# Patient Record
Sex: Male | Born: 1970
Health system: Southern US, Community
[De-identification: ages and names within clinical notes are randomized; demographics above are authoritative.]

## PROBLEM LIST (undated history)

## (undated) DIAGNOSIS — E66811 Obesity, class 1: Secondary | ICD-10-CM

## (undated) DIAGNOSIS — F32A Depression, unspecified: Secondary | ICD-10-CM

## (undated) DIAGNOSIS — E669 Obesity, unspecified: Secondary | ICD-10-CM

## (undated) DIAGNOSIS — R030 Elevated blood-pressure reading, without diagnosis of hypertension: Secondary | ICD-10-CM

## (undated) DIAGNOSIS — M47816 Spondylosis without myelopathy or radiculopathy, lumbar region: Secondary | ICD-10-CM

## (undated) DIAGNOSIS — F419 Anxiety disorder, unspecified: Secondary | ICD-10-CM

## (undated) DIAGNOSIS — J189 Pneumonia, unspecified organism: Secondary | ICD-10-CM

## (undated) DIAGNOSIS — Z8679 Personal history of other diseases of the circulatory system: Secondary | ICD-10-CM

## (undated) DIAGNOSIS — M545 Low back pain, unspecified: Secondary | ICD-10-CM

## (undated) DIAGNOSIS — G4752 REM sleep behavior disorder: Secondary | ICD-10-CM

## (undated) DIAGNOSIS — E781 Pure hyperglyceridemia: Secondary | ICD-10-CM

## (undated) DIAGNOSIS — M7711 Lateral epicondylitis, right elbow: Secondary | ICD-10-CM

## (undated) DIAGNOSIS — J309 Allergic rhinitis, unspecified: Secondary | ICD-10-CM

## (undated) DIAGNOSIS — I10 Essential (primary) hypertension: Secondary | ICD-10-CM

## (undated) DIAGNOSIS — F329 Major depressive disorder, single episode, unspecified: Secondary | ICD-10-CM

## (undated) DIAGNOSIS — E039 Hypothyroidism, unspecified: Secondary | ICD-10-CM

## (undated) DIAGNOSIS — K5790 Diverticulosis of intestine, part unspecified, without perforation or abscess without bleeding: Secondary | ICD-10-CM

## (undated) DIAGNOSIS — K219 Gastro-esophageal reflux disease without esophagitis: Secondary | ICD-10-CM

## (undated) DIAGNOSIS — K5792 Diverticulitis of intestine, part unspecified, without perforation or abscess without bleeding: Secondary | ICD-10-CM

## (undated) DIAGNOSIS — M7712 Lateral epicondylitis, left elbow: Secondary | ICD-10-CM

## (undated) DIAGNOSIS — Z8701 Personal history of pneumonia (recurrent): Secondary | ICD-10-CM

## (undated) DIAGNOSIS — G8929 Other chronic pain: Secondary | ICD-10-CM

## (undated) DIAGNOSIS — G4726 Circadian rhythm sleep disorder, shift work type: Secondary | ICD-10-CM

## (undated) DIAGNOSIS — G4733 Obstructive sleep apnea (adult) (pediatric): Secondary | ICD-10-CM

## (undated) DIAGNOSIS — M1710 Unilateral primary osteoarthritis, unspecified knee: Secondary | ICD-10-CM

## (undated) HISTORY — DX: Spondylosis without myelopathy or radiculopathy, lumbar region: M47.816

## (undated) HISTORY — DX: Allergic rhinitis, unspecified: J30.9

## (undated) HISTORY — DX: Unilateral primary osteoarthritis, unspecified knee: M17.10

## (undated) HISTORY — DX: Essential (primary) hypertension: I10

## (undated) HISTORY — DX: Diverticulitis of intestine, part unspecified, without perforation or abscess without bleeding: K57.92

## (undated) HISTORY — DX: Lateral epicondylitis, right elbow: M77.11

## (undated) HISTORY — DX: REM sleep behavior disorder: G47.52

## (undated) HISTORY — DX: Other chronic pain: G89.29

## (undated) HISTORY — DX: Low back pain, unspecified: M54.50

## (undated) HISTORY — DX: Elevated blood-pressure reading, without diagnosis of hypertension: R03.0

## (undated) HISTORY — DX: Obesity, class 1: E66.811

## (undated) HISTORY — PX: ELBOW SURGERY: SHX618

## (undated) HISTORY — DX: Circadian rhythm sleep disorder, shift work type: G47.26

## (undated) HISTORY — DX: Obstructive sleep apnea (adult) (pediatric): G47.33

## (undated) HISTORY — DX: Depression, unspecified: F32.A

## (undated) HISTORY — DX: Personal history of other diseases of the circulatory system: Z86.79

## (undated) HISTORY — DX: Pure hyperglyceridemia: E78.1

## (undated) HISTORY — DX: Major depressive disorder, single episode, unspecified: F32.9

## (undated) HISTORY — DX: Lateral epicondylitis, left elbow: M77.12

## (undated) HISTORY — DX: Low back pain: M54.5

## (undated) HISTORY — DX: Personal history of pneumonia (recurrent): Z87.01

## (undated) HISTORY — PX: OTHER SURGICAL HISTORY: SHX169

## (undated) HISTORY — DX: Diverticulosis of intestine, part unspecified, without perforation or abscess without bleeding: K57.90

## (undated) HISTORY — DX: Anxiety disorder, unspecified: F41.9

## (undated) HISTORY — DX: Obesity, unspecified: E66.9

---
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
## 1996-01-15 DIAGNOSIS — Z8679 Personal history of other diseases of the circulatory system: Secondary | ICD-10-CM

## 1996-01-15 HISTORY — DX: Personal history of other diseases of the circulatory system: Z86.79

## 1998-01-10 ENCOUNTER — Emergency Department (HOSPITAL_COMMUNITY): Admission: EM | Admit: 1998-01-10 | Discharge: 1998-01-10 | Payer: Self-pay | Admitting: Emergency Medicine

## 1998-06-06 ENCOUNTER — Inpatient Hospital Stay (HOSPITAL_COMMUNITY): Admission: EM | Admit: 1998-06-06 | Discharge: 1998-06-10 | Payer: Self-pay | Admitting: Emergency Medicine

## 1998-06-06 ENCOUNTER — Encounter: Payer: Self-pay | Admitting: Internal Medicine

## 1998-06-08 ENCOUNTER — Encounter: Payer: Self-pay | Admitting: Emergency Medicine

## 2002-12-30 ENCOUNTER — Emergency Department (HOSPITAL_COMMUNITY): Admission: EM | Admit: 2002-12-30 | Discharge: 2002-12-30 | Payer: Self-pay | Admitting: Emergency Medicine

## 2009-12-21 ENCOUNTER — Emergency Department (HOSPITAL_COMMUNITY): Admission: EM | Admit: 2009-12-21 | Discharge: 2009-10-04 | Payer: Self-pay | Admitting: Emergency Medicine

## 2011-02-26 ENCOUNTER — Emergency Department (HOSPITAL_COMMUNITY): Payer: Worker's Compensation

## 2011-02-26 ENCOUNTER — Emergency Department (HOSPITAL_COMMUNITY)
Admission: EM | Admit: 2011-02-26 | Discharge: 2011-02-26 | Disposition: A | Discharge status: Home / Self Care | Payer: Worker's Compensation | Attending: Emergency Medicine | Admitting: Emergency Medicine

## 2011-02-26 ENCOUNTER — Encounter (HOSPITAL_COMMUNITY): Payer: Self-pay | Admitting: Emergency Medicine

## 2011-02-26 DIAGNOSIS — S4980XA Other specified injuries of shoulder and upper arm, unspecified arm, initial encounter: Secondary | ICD-10-CM | POA: Insufficient documentation

## 2011-02-26 DIAGNOSIS — M25519 Pain in unspecified shoulder: Secondary | ICD-10-CM | POA: Insufficient documentation

## 2011-02-26 DIAGNOSIS — M542 Cervicalgia: Secondary | ICD-10-CM | POA: Insufficient documentation

## 2011-02-26 DIAGNOSIS — X500XXA Overexertion from strenuous movement or load, initial encounter: Secondary | ICD-10-CM | POA: Insufficient documentation

## 2011-02-26 DIAGNOSIS — S46909A Unspecified injury of unspecified muscle, fascia and tendon at shoulder and upper arm level, unspecified arm, initial encounter: Secondary | ICD-10-CM | POA: Insufficient documentation

## 2011-02-26 DIAGNOSIS — F17200 Nicotine dependence, unspecified, uncomplicated: Secondary | ICD-10-CM | POA: Insufficient documentation

## 2011-02-26 DIAGNOSIS — Y990 Civilian activity done for income or pay: Secondary | ICD-10-CM | POA: Insufficient documentation

## 2011-02-26 DIAGNOSIS — Z79899 Other long term (current) drug therapy: Secondary | ICD-10-CM | POA: Insufficient documentation

## 2011-02-26 MED ORDER — HYDROCODONE-ACETAMINOPHEN 5-325 MG PO TABS: 1.0000 | ORAL_TABLET | ORAL | Status: DC | PRN

## 2011-02-26 MED ORDER — IBUPROFEN 800 MG PO TABS: 800.0000 mg | ORAL_TABLET | Freq: Three times a day (TID) | ORAL | Status: AC

## 2011-02-26 MED ORDER — OXYCODONE-ACETAMINOPHEN 5-325 MG PO TABS: 1.0000 | ORAL_TABLET | ORAL | Status: AC | PRN

## 2011-02-26 NOTE — ED Notes (Signed)
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

## 2011-02-26 NOTE — ED Provider Notes (Signed)
History     CSN: [id number]  Arrival date & time 02/26/11  0204   First MD Initiated Contact with Patient 02/26/11 [phone]      Chief Complaint  Patient presents with  . Shoulder Injury    (Consider location/radiation/quality/duration/timing/severity/associated sxs/prior treatment) HPI History provided by pt.   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  Pain in right posterior neck as well. Aggravated by shoulder flexion.  Denies RUE weakness and paresthesias.  No prior h/o shoulder injury.    History reviewed. No pertinent past medical history.  Past Surgical History  Procedure Date  . Spleenectomy   . Arthroscopic knee surgery   . Elbow surgery   . Extraction of wisdom teeth     History reviewed. No pertinent family history.  History  Substance Use Topics  . Smoking status: Current Some Day Smoker  . Smokeless tobacco: Former Neurosurgeon  . Alcohol Use: Yes      Review of Systems  All other systems reviewed and are negative.    Allergies  Penicillins  Home Medications   Current Outpatient Rx  Name Route Sig Dispense Refill  . HYDROCODONE-ACETAMINOPHEN 10-325 MG PO TABS Oral Take 1 tablet by mouth every 6 (six) hours as needed. Pain    . LANSOPRAZOLE 15 MG PO CPDR Oral Take 15 mg by mouth daily.    Marland Kitchen LEVOTHYROXINE SODIUM 50 MCG PO TABS Oral Take 50 mcg by mouth daily.    Marland Kitchen PAROXETINE HCL 20 MG PO TABS Oral Take 20 mg by mouth every morning.    . IBUPROFEN 800 MG PO TABS Oral Take 1 tablet (800 mg total) by mouth 3 (three) times daily. 12 tablet 0  . OXYCODONE-ACETAMINOPHEN 5-325 MG PO TABS Oral Take 1 tablet by mouth every 4 (four) hours as needed for pain. 15 tablet 0    BP 152/78  Pulse 72  Temp(Src) 98.4 F (36.9 C) (Oral)  Resp 18  SpO2 98%  Physical Exam  Musculoskeletal:       Cervical spine non-tender.  Right trap non-tender.  Right shoulder w/out deformity or skin changes.   Mile tenderness at [facility] joint only.  Pain w/ passive flexion greater than 90deg.  Full abduction but reports pain while lower arm to side.  Nml elbow and wrist exam.  2+ radial pulse and distal sensation intact.     ED Course  Procedures (including critical care time)  Labs Reviewed - No data to display Dg Shoulder Right  02/26/2011  *RADIOLOGY REPORT*  Clinical Data: Painful right shoulder with limited range of motion. The patient was sanding a car and felt a pop.  RIGHT SHOULDER - 2+ VIEW  Comparison: None.  Findings: Mild degenerative changes in the glenohumeral and acromioclavicular joints.  No evidence of acute fracture or subluxation.  No focal bone lesion or bone destruction.  Bone cortex and trabecular architecture appear intact.  IMPRESSION: No acute bony abnormalities identified.  Original Report Authenticated By: Marlon Pel, M.D.     1. Pain in shoulder       MDM  Pt presents w/ acute onset right shoulder pain while waxing a car.  Xray neg for fx/dislocation.  Likely has a rotator cuff strain.  Nursing staff placed in a shoulder sling and pt d/c'd home w/ short course of percocet.  He takes vicodin qhs for chronic low back pain and did not have relief w/ this  medication prior to arrival.  Referred to ortho for persistent sx.          Arie Sabina [place], [place] 02/26/11 [phone]

## 2011-02-26 NOTE — Discharge Instructions (Signed)
Take vicodin as prescribed for severe pain.   Do not drive within four hours of taking this medication (may cause drowsiness or confusion).  Take ibuprofen w/ food up to three times a day, as needed for pain.  Ice and do gentle range of motion exercises at least 3 times a day.  Activity as tolerated.  Follow up with Dr. Charlann Boxer if pain has not started to improve in 5-7 days.   You may return to the ER if symptoms worsen or you have any other concerns.

## 2011-02-27 NOTE — ED Provider Notes (Signed)
Medical screening examination/treatment/procedure(s) were performed by non-physician practitioner and as supervising physician I was immediately available for consultation/collaboration.  Adithi Gammon K Aizza Santiago-Rasch, MD 02/27/11 2301 

## 2011-11-19 ENCOUNTER — Emergency Department (HOSPITAL_BASED_OUTPATIENT_CLINIC_OR_DEPARTMENT_OTHER): Payer: 59

## 2011-11-19 ENCOUNTER — Encounter (HOSPITAL_BASED_OUTPATIENT_CLINIC_OR_DEPARTMENT_OTHER): Payer: Self-pay | Admitting: Emergency Medicine

## 2011-11-19 ENCOUNTER — Emergency Department (HOSPITAL_BASED_OUTPATIENT_CLINIC_OR_DEPARTMENT_OTHER)
Admission: EM | Admit: 2011-11-19 | Discharge: 2011-11-19 | Disposition: A | Discharge status: Home / Self Care | Payer: 59 | Attending: Emergency Medicine | Admitting: Emergency Medicine

## 2011-11-19 DIAGNOSIS — F0781 Postconcussional syndrome: Secondary | ICD-10-CM

## 2011-11-19 DIAGNOSIS — F17200 Nicotine dependence, unspecified, uncomplicated: Secondary | ICD-10-CM | POA: Insufficient documentation

## 2011-11-19 DIAGNOSIS — R110 Nausea: Secondary | ICD-10-CM | POA: Insufficient documentation

## 2011-11-19 MED ORDER — MECLIZINE HCL 25 MG PO TABS: 25.0000 mg | ORAL_TABLET | Freq: Four times a day (QID) | ORAL | Status: DC | PRN

## 2011-11-19 NOTE — ED Provider Notes (Signed)
History     CSN: [id number]  Arrival date & time 11/19/11  4098   First MD Initiated Contact with Patient 11/19/11 [phone]      Chief Complaint  Patient presents with  . Dizziness    (Consider location/radiation/quality/duration/timing/severity/associated sxs/prior treatment) HPI This 41 year old male fell a few steps of the deer tree stand 4 days ago and since then has had some mild fleeting transient intermittent slight vertigo with some nausea with certain position changes and some slight decreased hearing in his right ear. He has had no headache. He has had no amnesia loss of consciousness altered mental status or change in speech vision swallowing or understanding and no localized or lateralizing weakness numbness or incoordination. He was at work tonight when he bent over a certain position causing some slight nausea and slight vertigo which is now resolved. His coworkers told him to come to the ED to get checked out for possible concussion. He has not had a headache but he did have a large amount of swelling to the left parietal scalp area where he has had at that swelling is now resolved. She's had no neck pain back pain chest pain shortness breath or abdominal pain. He's had no pain to his extremities other than some transient left upper arm bruising without bony pain or joint pain weakness or numbness. There is no treatment prior to arrival. He has had elevated blood pressures that his doctor is following, but has not been started on blood pressure medicines yet. History reviewed. No pertinent past medical history.  Past Surgical History  Procedure Date  . Spleenectomy   . Arthroscopic knee surgery   . Elbow surgery   . Extraction of wisdom teeth     History reviewed. No pertinent family history.  History  Substance Use Topics  . Smoking status: Current Some Day Smoker  . Smokeless tobacco: Former Neurosurgeon  . Alcohol Use: Yes      Review of Systems 10 Systems reviewed and  are negative for acute change except as noted in the HPI. Allergies  Penicillins  Home Medications   Current Outpatient Rx  Name  Route  Sig  Dispense  Refill  . LANSOPRAZOLE 15 MG PO CPDR   Oral   Take 15 mg by mouth daily.         Marland Kitchen LEVOTHYROXINE SODIUM 50 MCG PO TABS   Oral   Take 50 mcg by mouth daily.         Marland Kitchen PAROXETINE HCL 20 MG PO TABS   Oral   Take 20 mg by mouth every morning.         Marland Kitchen HYDROCODONE-ACETAMINOPHEN 10-325 MG PO TABS   Oral   Take 1 tablet by mouth every 6 (six) hours as needed. Pain         . MECLIZINE HCL 25 MG PO TABS   Oral   Take 1 tablet (25 mg total) by mouth every 6 (six) hours as needed for dizziness.   20 tablet   0     BP 157/94  Pulse 86  Resp 16  SpO2 100%  Physical Exam  Nursing note and vitals reviewed. Constitutional:       Awake, alert, nontoxic appearance with baseline speech for patient.  HENT:  Mouth/Throat: No oropharyngeal exudate.       Patient has minimal tenderness to the left parietal scalp area where he had a hematoma after the fall where he hit his head. There is no swelling or  palpable deformity to the area now.  TMs clear bilat.  Eyes: EOM are normal. Pupils are equal, round, and reactive to light. Right eye exhibits no discharge. Left eye exhibits no discharge.  Neck: Neck supple.  Cardiovascular: Normal rate and regular rhythm.   No murmur heard. Pulmonary/Chest: Effort normal and breath sounds normal. No stridor. No respiratory distress. He has no wheezes. He has no rales. He exhibits no tenderness.  Abdominal: Soft. Bowel sounds are normal. He exhibits no mass. There is no tenderness. There is no rebound.  Musculoskeletal: He exhibits no tenderness.       Baseline ROM, moves extremities with no obvious new focal weakness.  Lymphadenopathy:    He has no cervical adenopathy.  Neurological:       Awake, alert, cooperative and aware of situation; motor strength bilaterally; sensation normal to light  touch bilaterally; peripheral visual fields full to confrontation; no facial asymmetry; tongue midline; major cranial nerves appear intact; no pronator drift, normal finger to nose bilaterally, baseline gait without new ataxia.  There is currently no nystagmus and no disconjugate gaze when eyes are uncovered one at a time.  Skin: No rash noted.  Psychiatric: He has a normal mood and affect.    ED Course  Procedures (including critical care time)  Labs Reviewed - No data to display Ct Head Wo Contrast  11/19/2011  *RADIOLOGY REPORT*  Clinical Data: Status post fall from deer stand; hematoma on the left side of the head, with headache and dizziness.  CT HEAD WITHOUT CONTRAST  Technique:  Contiguous axial images were obtained from the base of the skull through the vertex without contrast.  Comparison: None.  Findings: There is no evidence of acute infarction, mass lesion, or intra- or extra-axial hemorrhage on CT.  Tiny foci of increased attenuation within the lateral ventricles are thought to reflect choroid plexus calcification.  The posterior fossa, including the cerebellum, brainstem and fourth ventricle, is within normal limits.  The third and lateral ventricles, and basal ganglia are unremarkable in appearance.  The cerebral hemispheres are symmetric in appearance, with normal gray- white differentiation.  No mass effect or midline shift is seen.  There is no evidence of fracture; visualized osseous structures are unremarkable in appearance.  The visualized portions of the orbits are within normal limits.  The paranasal sinuses and mastoid air cells are well-aerated.  A small focal hematoma is noted overlying the left frontoparietal calvarium.  IMPRESSION:  1.  No evidence of traumatic intracranial injury or fracture. 2.  Small scalp hematoma overlying the left frontoparietal calvarium.   Original Report Authenticated By: Tonia Ghent, M.D.      1. Postconcussive syndrome       MDM  Pt stable  in ED with no significant deterioration in condition.  Patient / Family / Caregiver informed of clinical course, understand medical decision-making process, and agree with plan.  I doubt any other EMC precluding discharge at this time including, but not necessarily limited to the following:[facility].         Hurman Horn, MD 11/19/11 [phone]

## 2011-11-19 NOTE — ED Notes (Signed)
Pt reports no loss of consciousness from fall

## 2011-11-19 NOTE — ED Notes (Signed)
Pt noted to have small hematoma to left side of head, denies h/a, blurred vision

## 2011-11-19 NOTE — ED Notes (Signed)
Pt states he fell out of tree stand to ground on Friday nov 1, pt reports aroun 5-6 foot fall, fell on left arm and hit left side of head. Pt has noticed decreased hearing and developed dizziness today

## 2012-01-15 DIAGNOSIS — M7711 Lateral epicondylitis, right elbow: Secondary | ICD-10-CM

## 2012-01-15 HISTORY — DX: Lateral epicondylitis, right elbow: M77.11

## 2014-03-03 ENCOUNTER — Emergency Department (HOSPITAL_COMMUNITY)
Admission: EM | Admit: 2014-03-03 | Discharge: 2014-03-03 | Disposition: A | Discharge status: Home / Self Care | Payer: Worker's Compensation | Attending: Emergency Medicine | Admitting: Emergency Medicine

## 2014-03-03 ENCOUNTER — Encounter (HOSPITAL_COMMUNITY): Payer: Self-pay | Admitting: *Deleted

## 2014-03-03 DIAGNOSIS — Y998 Other external cause status: Secondary | ICD-10-CM | POA: Diagnosis not present

## 2014-03-03 DIAGNOSIS — Z8739 Personal history of other diseases of the musculoskeletal system and connective tissue: Secondary | ICD-10-CM | POA: Insufficient documentation

## 2014-03-03 DIAGNOSIS — Z720 Tobacco use: Secondary | ICD-10-CM | POA: Insufficient documentation

## 2014-03-03 DIAGNOSIS — Z791 Long term (current) use of non-steroidal anti-inflammatories (NSAID): Secondary | ICD-10-CM | POA: Insufficient documentation

## 2014-03-03 DIAGNOSIS — W228XXA Striking against or struck by other objects, initial encounter: Secondary | ICD-10-CM | POA: Insufficient documentation

## 2014-03-03 DIAGNOSIS — Y9289 Other specified places as the place of occurrence of the external cause: Secondary | ICD-10-CM | POA: Insufficient documentation

## 2014-03-03 DIAGNOSIS — Z79899 Other long term (current) drug therapy: Secondary | ICD-10-CM | POA: Diagnosis not present

## 2014-03-03 DIAGNOSIS — S71111A Laceration without foreign body, right thigh, initial encounter: Secondary | ICD-10-CM

## 2014-03-03 DIAGNOSIS — Y9389 Activity, other specified: Secondary | ICD-10-CM | POA: Insufficient documentation

## 2014-03-03 MED ORDER — BACITRACIN ZINC 500 UNIT/GM EX OINT: 1.0000 "application " | TOPICAL_OINTMENT | Freq: Two times a day (BID) | CUTANEOUS | Status: DC

## 2014-03-03 MED ORDER — LIDOCAINE-EPINEPHRINE (PF) 2 %-1:200000 IJ SOLN
10.0000 mL | Freq: Once | INTRAMUSCULAR | Status: DC
  Filled 2014-03-03: qty 20

## 2014-03-03 NOTE — Discharge Instructions (Signed)

## 2014-03-03 NOTE — ED Notes (Signed)
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

## 2014-03-03 NOTE — ED Provider Notes (Signed)
CSN: [id number]     Arrival date & time 03/03/14  0423 History   First MD Initiated Contact with Patient 03/03/14 [phone]     Chief Complaint  Patient presents with  . Extremity Laceration    (Consider location/radiation/quality/duration/timing/severity/associated sxs/prior Treatment) Patient is a 44 y.o. male presenting with skin laceration. The history is provided by the patient. No language interpreter was used.  Laceration Location:  Leg Leg laceration location:  R upper leg Length (cm):  6cm Depth:  Through underlying tissue Quality: straight   Bleeding: controlled   Time since incident:  30 minutes Injury mechanism: lost control of power sander which hit patient in R thigh. Pain details:    Quality:  Burning   Severity:  Mild   Timing:  Constant   Progression:  Unchanged Foreign body present:  No foreign bodies Worsened by:  Pressure Tetanus status:  Up to date   Past Medical History  Diagnosis Date  . Ruptured lumbar disc    Past Surgical History  Procedure Laterality Date  . Spleenectomy    . Arthroscopic knee surgery    . Elbow surgery    . Extraction of wisdom teeth     No family history on file. History  Substance Use Topics  . Smoking status: Current Every Day Smoker  . Smokeless tobacco: Former NeurosurgeonUser    Types: Snuff  . Alcohol Use: Yes    Review of Systems  Musculoskeletal: Positive for myalgias.  Skin: Positive for wound.  All other systems reviewed and are negative.   Allergies  Penicillins  Home Medications   Prior to Admission medications   Medication Sig Start Date End Date Taking? Authorizing Provider  bacitracin ointment Apply 1 application topically 2 (two) times daily. 03/03/14   Antony MaduraKelly Deem Marmol, PA-C  diclofenac (VOLTAREN) 75 MG EC tablet Take 75 mg by mouth every 12 (twelve) hours. 02/21/14   Historical Provider, MD  HYDROcodone-acetaminophen (NORCO) 10-325 MG per tablet Take 1 tablet by mouth every 6 (six) hours as needed. Pain     Historical Provider, MD  lansoprazole (PREVACID) 15 MG capsule Take 15 mg by mouth daily.    Historical Provider, MD  levothyroxine (SYNTHROID, LEVOTHROID) 75 MCG tablet Take 75 mcg by mouth daily. 12/27/13   Historical Provider, MD  meclizine (ANTIVERT) 25 MG tablet Take 1 tablet (25 mg total) by mouth every 6 (six) hours as needed for dizziness. 11/19/11   Hurman HornJohn M Bednar, MD  PARoxetine (PAXIL) 20 MG tablet Take 20 mg by mouth every morning.    Historical Provider, MD   BP 154/85 mmHg  Pulse 83  Temp(Src) 98.9 F (37.2 C) (Oral)  Resp 20  SpO2 99%   Physical Exam  Constitutional: He is oriented to person, place, and time. He appears well-developed and well-nourished. No distress.  Patient nontoxic/nonseptic appearing  HENT:  Head: Normocephalic and atraumatic.  Eyes: Conjunctivae and EOM are normal. No scleral icterus.  Neck: Normal range of motion.  Cardiovascular: Normal rate, regular rhythm and intact distal pulses.   DP and PT pulse 2+ in RLE  Pulmonary/Chest: Effort normal. No respiratory distress.  Respirations even and unlabored  Musculoskeletal: Normal range of motion.       Right upper leg: He exhibits tenderness and laceration. He exhibits no bony tenderness, no swelling, no edema and no deformity.       Legs: Neurological: He is alert and oriented to person, place, and time. He exhibits normal muscle tone. Coordination normal.  Sensation to light  touch intact in right lower extremity.  Skin: Skin is warm and dry. No rash noted. He is not diaphoretic. No erythema. No pallor.  6 cm laceration noted to the mid to distal right thigh. Bleeding controlled. Laceration noted to be through subcutaneous fat. No muscle involvement or exposure. No foreign bodies noted.  Psychiatric: He has a normal mood and affect. His behavior is normal.  Nursing note and vitals reviewed.   ED Course  Procedures (including critical care time) Labs Review Labs Reviewed - No data to  display  Imaging Review No results found.   EKG Interpretation None      LACERATION REPAIR Performed by: Antony Madura Authorized by: Antony Madura Consent: Verbal consent obtained. Risks and benefits: risks, benefits and alternatives were discussed Consent given by: patient Patient identity confirmed: provided demographic data Prepped and Draped in normal sterile fashion Wound explored  Laceration Location: R thigh  Laceration Length: 6cm  No Foreign Bodies seen or palpated  Anesthesia: local infiltration  Local anesthetic: lidocaine 2% with epinephrine  Anesthetic total: 12 ml  Irrigation method: syringe Amount of cleaning: standard  Skin closure: 4-0 ethilon  Number of sutures: 4 horizontal mattress; 1 simple interrupted  Technique: see above  Patient tolerance: Patient tolerated the procedure well with no immediate complications.  MDM   Final diagnoses:  Thigh laceration, right, initial encounter    Tdap booster UTD. Pressure irrigation performed. Laceration occurred < 8 hours prior to repair which was well tolerated. Patient has no comorbidities to effect normal wound healing. Discussed suture home care with patient and answered questions. Patient to follow up for wound check and suture removal in 12 days with PCP. Patient is hemodynamically stable with no complaints prior to discharge. Return precautions given. Patient agreeable to plan with no unaddressed concerns.     Filed Vitals:   03/03/14 0429  BP: 154/85  Pulse: 83  Temp: 98.9 F (37.2 C)  TempSrc: Oral  Resp: 20  SpO2: 99%     Antony Madura, PA-C 03/03/14 0540  Olivia Mackie, MD 03/03/14 [phone]

## 2014-03-15 ENCOUNTER — Encounter (HOSPITAL_COMMUNITY): Payer: Self-pay | Admitting: Emergency Medicine

## 2014-03-15 ENCOUNTER — Emergency Department (HOSPITAL_COMMUNITY)
Admission: EM | Admit: 2014-03-15 | Discharge: 2014-03-15 | Disposition: A | Discharge status: Home / Self Care | Payer: Worker's Compensation | Attending: Emergency Medicine | Admitting: Emergency Medicine

## 2014-03-15 DIAGNOSIS — Z79899 Other long term (current) drug therapy: Secondary | ICD-10-CM | POA: Diagnosis not present

## 2014-03-15 DIAGNOSIS — Z8739 Personal history of other diseases of the musculoskeletal system and connective tissue: Secondary | ICD-10-CM | POA: Diagnosis not present

## 2014-03-15 DIAGNOSIS — Z87891 Personal history of nicotine dependence: Secondary | ICD-10-CM | POA: Insufficient documentation

## 2014-03-15 DIAGNOSIS — Z880 Allergy status to penicillin: Secondary | ICD-10-CM | POA: Insufficient documentation

## 2014-03-15 DIAGNOSIS — Z4802 Encounter for removal of sutures: Secondary | ICD-10-CM | POA: Diagnosis present

## 2014-03-15 DIAGNOSIS — Z792 Long term (current) use of antibiotics: Secondary | ICD-10-CM | POA: Diagnosis not present

## 2014-03-15 NOTE — Discharge Instructions (Signed)

## 2014-03-15 NOTE — ED Notes (Signed)
Pt states he needs his stitches taken out of his right leg  Pt states they have been in 12 days today

## 2014-03-15 NOTE — ED Provider Notes (Signed)
CSN: [id number]     Arrival date & time 03/15/14  [id number] History   First MD Initiated Contact with Patient 03/15/14 [phone]     Chief Complaint  Patient presents with  . Suture / Staple Removal     (Consider location/radiation/quality/duration/timing/severity/associated sxs/prior Treatment) Patient is a 44 y.o. male presenting with suture removal. The history is provided by the patient. No language interpreter was used.  Suture / Staple Removal Pertinent negatives include no chills, fever or numbness. Associated symptoms comments: Here for suture removal from laceration repair performed 03/03/14 to right thigh. No complaints. .    Past Medical History  Diagnosis Date  . Ruptured lumbar disc    Past Surgical History  Procedure Laterality Date  . Spleenectomy    . Arthroscopic knee surgery    . Elbow surgery    . Extraction of wisdom teeth     History reviewed. No pertinent family history. History  Substance Use Topics  . Smoking status: Former Games developermoker  . Smokeless tobacco: Current User    Types: Snuff, Chew  . Alcohol Use: Yes    Review of Systems  Constitutional: Negative for fever and chills.  Musculoskeletal: Negative.   Skin:       See HPI.  Neurological: Negative.  Negative for numbness.      Allergies  Penicillins  Home Medications   Prior to Admission medications   Medication Sig Start Date End Date Taking? Authorizing Provider  bacitracin ointment Apply 1 application topically 2 (two) times daily. 03/03/14   Antony MaduraKelly Humes, PA-C  diclofenac (VOLTAREN) 75 MG EC tablet Take 75 mg by mouth every 12 (twelve) hours. 02/21/14   Historical Provider, MD  HYDROcodone-acetaminophen (NORCO) 10-325 MG per tablet Take 1 tablet by mouth every 6 (six) hours as needed. Pain    Historical Provider, MD  lansoprazole (PREVACID) 15 MG capsule Take 15 mg by mouth daily.    Historical Provider, MD  levothyroxine (SYNTHROID, LEVOTHROID) 75 MCG tablet Take 75 mcg by mouth daily. 12/27/13    Historical Provider, MD  meclizine (ANTIVERT) 25 MG tablet Take 1 tablet (25 mg total) by mouth every 6 (six) hours as needed for dizziness. 11/19/11   Hurman HornJohn M Bednar, MD  PARoxetine (PAXIL) 20 MG tablet Take 20 mg by mouth every morning.    Historical Provider, MD   BP 158/101 mmHg  Pulse 85  Temp(Src) 98.1 F (36.7 C) (Oral)  Resp 18  SpO2 96% Physical Exam  Constitutional: He appears well-developed and well-nourished. No distress.  Skin:  Well healed laceration anterior right thigh with intact sutures. No drainage, redness.    ED Course  Procedures (including critical care time) Labs Review Labs Reviewed - No data to display  Imaging Review No results found.   EKG Interpretation None     PROCEDURE:   Sutures x 5 (4 horizontal mattress, one simple interrupted) sutures removed by Cameran Ahmed PA-C. MDM   Final diagnoses:  None    1. Suture removal.  Uncomplicated laceration repair.     Arnoldo HookerShari A Kimmarie Pascale, PA-C 03/15/14 [phone]  Tomasita CrumbleAdeleke Oni, MD 03/15/14 [phone]

## 2014-10-05 ENCOUNTER — Ambulatory Visit: Payer: Self-pay | Admitting: Orthopedic Surgery

## 2014-10-05 NOTE — Patient Instructions (Signed)
Nicholas Mckinney  10/05/2014   Your procedure is scheduled on:   10/13/2014    Report to [facility] Main  Entrance take [facility]  elevators to 3rd floor to  Short Stay Center at     0900 AM.  Call this number if you have problems the morning of surgery [phone]   Remember: ONLY 1 PERSON MAY GO WITH YOU TO SHORT STAY TO GET  READY MORNING OF YOUR SURGERY.  Do not eat food or drink liquids :After Midnight.     Take these medicines the morning of surgery with A SIP OF WATER:   Levothyroxine ( synthroid), Lansoprazole ( Prevacid), Paxil ( Paroxetine)  DO NOT TAKE ANY DIABETIC MEDICATIONS DAY OF YOUR SURGERY                               You may not have any metal on your body including hair pins and              piercings  Do not wear jewelry, make-up, lotions, powders or perfumes, deodorant             Do not wear nail polish.  Do not shave  48 hours prior to surgery.              Men may shave face and neck.   Do not bring valuables to the hospital. [name] IS NOT             RESPONSIBLE   FOR VALUABLES.  Contacts, dentures or bridgework may not be worn into surgery.  Leave suitcase in the car. After surgery it may be brought to your room.         Special Instructions: coughing and deep breathing exercises,leg exercises               Please read over the following fact sheets you were given: _____________________________________________________________________             [facility] - Preparing for Surgery Before surgery, you can play an important role.  Because skin is not sterile, your skin needs to be as free of germs as possible.  You can reduce the number of germs on your skin by washing with CHG (chlorahexidine gluconate) soap before surgery.  CHG is an antiseptic cleaner which kills germs and bonds with the skin to continue killing germs even after washing. Please DO NOT use if you have an allergy to CHG or antibacterial soaps.  If your skin  becomes reddened/irritated stop using the CHG and inform your nurse when you arrive at Short Stay. Do not shave (including legs and underarms) for at least 48 hours prior to the first CHG shower.  You may shave your face/neck. Please follow these instructions carefully:  1.  Shower with CHG Soap the night before surgery and the  morning of Surgery.  2.  If you choose to wash your hair, wash your hair first as usual with your  normal  shampoo.  3.  After you shampoo, rinse your hair and body thoroughly to remove the  shampoo.                           4.  Use CHG as you would any other liquid soap.  You can apply chg directly  to the skin and wash                       Gently with a scrungie or clean washcloth.  5.  Apply the CHG Soap to your body ONLY FROM THE NECK DOWN.   Do not use on face/ open                           Wound or open sores. Avoid contact with eyes, ears mouth and genitals (private parts).                       Wash face,  Genitals (private parts) with your normal soap.             6.  Wash thoroughly, paying special attention to the area where your surgery  will be performed.  7.  Thoroughly rinse your body with warm water from the neck down.  8.  DO NOT shower/wash with your normal soap after using and rinsing off  the CHG Soap.                9.  Pat yourself dry with a clean towel.            10.  Wear clean pajamas.            11.  Place clean sheets on your bed the night of your first shower and do not  sleep with pets. Day of Surgery : Do not apply any lotions/deodorants the morning of surgery.  Please wear clean clothes to the hospital/surgery center.  FAILURE TO FOLLOW THESE INSTRUCTIONS MAY RESULT IN THE CANCELLATION OF YOUR SURGERY PATIENT SIGNATURE_________________________________  NURSE SIGNATURE__________________________________  ________________________________________________________________________   Rogelia Mire  An incentive spirometer is a  tool that can help keep your lungs clear and active. This tool measures how well you are filling your lungs with each breath. Taking long deep breaths may help reverse or decrease the chance of developing breathing (pulmonary) problems (especially infection) following:  A long period of time when you are unable to move or be active. BEFORE THE PROCEDURE   If the spirometer includes an indicator to show your best effort, your nurse or respiratory therapist will set it to a desired goal.  If possible, sit up straight or lean slightly forward. Try not to slouch.  Hold the incentive spirometer in an upright position. INSTRUCTIONS FOR USE  1. Sit on the edge of your bed if possible, or sit up as far as you can in bed or on a chair. 2. Hold the incentive spirometer in an upright position. 3. Breathe out normally. 4. Place the mouthpiece in your mouth and seal your lips tightly around it. 5. Breathe in slowly and as deeply as possible, raising the piston or the ball toward the top of the column. 6. Hold your breath for 3-5 seconds or for as long as possible. Allow the piston or ball to fall to the bottom of the column. 7. Remove the mouthpiece from your mouth and breathe out normally. 8. Rest for a few seconds and repeat Steps 1 through 7 at least 10 times every 1-2 hours when you are awake. Take your time and take a few normal breaths between deep breaths. 9. The spirometer may include an indicator to show your best effort. Use the indicator as a goal to work toward during each repetition. 10. After each  set of 10 deep breaths, practice coughing to be sure your lungs are clear. If you have an incision (the cut made at the time of surgery), support your incision when coughing by placing a pillow or rolled up towels firmly against it. Once you are able to get out of bed, walk around indoors and cough well. You may stop using the incentive spirometer when instructed by your caregiver.  RISKS AND  COMPLICATIONS  Take your time so you do not get dizzy or light-headed.  If you are in pain, you may need to take or ask for pain medication before doing incentive spirometry. It is harder to take a deep breath if you are having pain. AFTER USE  Rest and breathe slowly and easily.  It can be helpful to keep track of a log of your progress. Your caregiver can provide you with a simple table to help with this. If you are using the spirometer at home, follow these instructions: SEEK MEDICAL CARE IF:   You are having difficultly using the spirometer.  You have trouble using the spirometer as often as instructed.  Your pain medication is not giving enough relief while using the spirometer.  You develop fever of 100.5 F (38.1 C) or higher. SEEK IMMEDIATE MEDICAL CARE IF:   You cough up bloody sputum that had not been present before.  You develop fever of 102 F (38.9 C) or greater.  You develop worsening pain at or near the incision site. MAKE SURE YOU:   Understand these instructions.  Will watch your condition.  Will get help right away if you are not doing well or get worse. Document Released: 05/13/2006 Document Revised: 03/25/2011 Document Reviewed: 07/14/2006 [facility] Patient Information 2014 [place], [place].   ________________________________________________________________________

## 2014-10-05 NOTE — Progress Notes (Signed)
Please put orders in Epic surgery 10-13-14 pre op 10-06-14 Thanks

## 2014-10-05 NOTE — H&P (Signed)
Nicholas Mckinney is an 44 y.o. male.   Chief Complaint: back and L leg pain HPI: The patient is a 44 year old male who presents with back pain. The patient is here today in referral from Dr. Ethelene Hal. The patient reports low back symptoms including pain which began 8 year(s) ago without any known injury. and Symptoms include numbness (left foot), burning (left low back) and tingling (left leg). The pain radiates to the left buttock and left posterior thigh. The patient describes the pain as sharp. The patient describes the severity of their symptoms as 4 / 10. Symptoms are exacerbated by sitting. Current treatment includes nonsteroidal anti-inflammatory drugs (Ibuprofen), opioid analgesics (Norco), ice packs and heating pad. Prior to being seen today the patient was previously evaluated by Dr. Ethelene Hal. Past evaluation has included MRI of the lumbar spine. Past treatment has included nonsteroidal anti-inflammatory drugs, opioid analgesics and Left S1 SNRB.  His MRI 09/16/2014 demonstrates moderate disc degeneration at L4-5 and at L5-S1, mild at L3-4. He does have a paracentral disc protrusion at L5-S1 to the left with slight extrusion. There is associated impingement of left S1 nerve root in the lateral recess secondary to facet hypertrophy and the disc protrusion. It is on image 21 of 31, series 5 of the axial images. T2, the right at L4-5, he has a disc protrusion on the right at L4-5 into the foramen of L4 on the right, but no impingement on the L5 root. L3-4, no disc protrusion noted. He also has mild foraminal stenosis of L5 on the left secondary to facet hypertrophy.  Past Medical History  Diagnosis Date  . Ruptured lumbar disc     Past Surgical History  Procedure Laterality Date  . Spleenectomy    . Arthroscopic knee surgery    . Elbow surgery    . Extraction of wisdom teeth      No family history on file. Social History:  reports that he has quit smoking. His smokeless tobacco use includes Snuff  and Chew. He reports that he drinks alcohol. He reports that he does not use illicit drugs.  Allergies:  Allergies  Allergen Reactions  . Penicillins     All cillins     (Not in a hospital admission)  No results found for this or any previous visit (from the past 48 hour(s)). No results found.  Review of Systems  Constitutional: Negative.   HENT: Negative.   Eyes: Negative.   Respiratory: Negative.   Cardiovascular: Negative.   Gastrointestinal: Negative.   Genitourinary: Negative.   Musculoskeletal: Positive for back pain.  Skin: Negative.   Neurological: Positive for sensory change and focal weakness.  Psychiatric/Behavioral: Negative.     There were no vitals taken for this visit. Physical Exam  Constitutional: He is oriented to person, place, and time. He appears well-developed. He appears distressed.  HENT:  Head: Normocephalic and atraumatic.  Eyes: Pupils are equal, round, and reactive to light.  Neck: Normal range of motion.  Cardiovascular: Normal rate.   Respiratory: Effort normal.  GI: Soft.  Musculoskeletal:  On exam, he is in moderate distress, mood and affect are appropriate. He walks with an antalgic gait. He is tender in the left proximal gluteus. He has pain in forward flexion and extension of lumbar spine. Straight leg raise, buttock, thigh and calf pain. He has decreased plantar flexion on the left, decreased Achilles reflex, decreased sensation in S1 dermatome. EHLs 5+ to 4/5, left compared to the right.  Lumbar spine  exam reveals no evidence of soft tissue swelling, deformity or skin ecchymosis. On palpation there is no tenderness of the lumbar spine. No flank pain with percussion. The abdomen is soft and nontender. Nontender over the trochanters. No cellulitis or lymphadenopathy.  Good range of motion of the lumbar spine without associated pain. Straight leg raise is negative. Motor is 5/5 including EHL, tibialis anterior, plantar flexion, quadriceps  and hamstrings. Patient is normoreflexic. There is no Babinski or clonus. Sensory exam is intact to light touch. Patient has good distal pulses. No DVT. No pain and normal range of motion without instability of the hips, knees and ankles.  Neurological: He is alert and oriented to person, place, and time.    X-ray three-view demonstrates disc degeneration at L4-5, L5-S1. No instability on flexion and extension.  MRI 09/16/2014 demonstrates a disc herniation paracentral left with facet hypertrophy and disc space narrowing contributing to spinal stenosis and S1 nerve root compression.  Assessment/Plan 1. Chronic S1 radiculopathy secondary to disc herniation, lateral recess stenosis, myotome weakness, dermatomal dysesthesia refractory, chronic narcotic analgesics, unable to work. 2. Disc degeneration multiple levels, predominantly L4-5, L5-S1.  I had an extensive discussion with [name] concerning currently pathology going over treatment options. Two options: One live with the symptoms, continue pain management strategies, avoid re-injury. Two would be to consider lumbar decompression at L5-S1 on the left to decompress the S1 nerve root in the lateral recess to addresses S1 radicular pain. I did indicate that would not fix the disc or alleviate his discogenic pain, but he seems more symptomatic from his buttock and leg. Discussed ultimately the option for a two-level fusion. They are not interested in that at this point in time. I therefore feel it is reasonable, given his persistent symptoms refractory to conservative treatment, where we can decompress lateral recess and alleviate pressure on the S1 nerve root. 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 We discussed residual back symptoms requiring disc pressure management and possibility of a recurrent disc herniation and fusion in the future. No history of MRSA. He is not  a smoker. He chews tobacco. Continue with his current analgesics that he is taking. He remains out of work. I discussed this with his wife. Spent considerable time discussing these issues, reviewing the pathology, relevant anatomy and treatment options. Again, we will proceed accordingly.  Plan microlumbar decompression L5-S1  BISSELL, JACLYN M. PA-C for Dr. Shelle Iron 10/05/2014, 5:26 PM

## 2014-10-06 ENCOUNTER — Encounter (HOSPITAL_COMMUNITY): Payer: Self-pay

## 2014-10-06 ENCOUNTER — Encounter (HOSPITAL_COMMUNITY)
Admission: RE | Admit: 2014-10-06 | Discharge: 2014-10-06 | Disposition: A | Discharge status: Home / Self Care | Payer: Commercial Managed Care - HMO | Source: Ambulatory Visit | Attending: Specialist | Admitting: Specialist

## 2014-10-06 ENCOUNTER — Ambulatory Visit (HOSPITAL_COMMUNITY)
Admission: RE | Admit: 2014-10-06 | Discharge: 2014-10-06 | Disposition: A | Discharge status: Home / Self Care | Payer: Commercial Managed Care - HMO | Source: Ambulatory Visit | Attending: Orthopedic Surgery | Admitting: Orthopedic Surgery

## 2014-10-06 DIAGNOSIS — M5126 Other intervertebral disc displacement, lumbar region: Secondary | ICD-10-CM | POA: Insufficient documentation

## 2014-10-06 DIAGNOSIS — Z01818 Encounter for other preprocedural examination: Secondary | ICD-10-CM | POA: Insufficient documentation

## 2014-10-06 DIAGNOSIS — Z01812 Encounter for preprocedural laboratory examination: Secondary | ICD-10-CM | POA: Diagnosis not present

## 2014-10-06 DIAGNOSIS — M5136 Other intervertebral disc degeneration, lumbar region: Secondary | ICD-10-CM | POA: Diagnosis not present

## 2014-10-06 HISTORY — DX: Hypothyroidism, unspecified: E03.9

## 2014-10-06 HISTORY — DX: Gastro-esophageal reflux disease without esophagitis: K21.9

## 2014-10-06 LAB — SURGICAL PCR SCREEN
MRSA, PCR: NEGATIVE
Staphylococcus aureus: POSITIVE — AB

## 2014-10-06 LAB — BASIC METABOLIC PANEL
Anion gap: 8 (ref 5–15)
BUN: 23 mg/dL — ABNORMAL HIGH (ref 6–20)
CHLORIDE: 102 mmol/L (ref 101–111)
CO2: 27 mmol/L (ref 22–32)
CREATININE: 1.07 mg/dL (ref 0.61–1.24)
Calcium: 9.3 mg/dL (ref 8.9–10.3)
GFR calc non Af Amer: >60 mL/min (ref >60)
Glucose, Bld: 101 mg/dL — ABNORMAL HIGH (ref 65–99)
POTASSIUM: 4.4 mmol/L (ref 3.5–5.1)
SODIUM: 137 mmol/L (ref 135–145)

## 2014-10-06 LAB — CBC
HEMATOCRIT: 44.3 % (ref 39.0–52.0)
Hemoglobin: 14.9 g/dL (ref 13.0–17.0)
MCH: 31.2 pg (ref 26.0–34.0)
MCHC: 33.6 g/dL (ref 30.0–36.0)
MCV: 92.7 fL (ref 78.0–100.0)
Platelets: 463 10*3/uL — ABNORMAL HIGH (ref 150–400)
RBC: 4.78 MIL/uL (ref 4.22–5.81)
RDW: 13.3 % (ref 11.5–15.5)
WBC: 8.5 10*3/uL (ref 4.0–10.5)

## 2014-10-06 NOTE — Progress Notes (Signed)
[facility] results done 10/06/14 faxed via EPIC to Dr Shelle Iron.

## 2014-10-07 ENCOUNTER — Ambulatory Visit: Payer: Self-pay | Admitting: Orthopedic Surgery

## 2014-10-13 ENCOUNTER — Ambulatory Visit (HOSPITAL_COMMUNITY): Payer: Commercial Managed Care - HMO

## 2014-10-13 ENCOUNTER — Ambulatory Visit (HOSPITAL_COMMUNITY): Payer: Commercial Managed Care - HMO | Admitting: Anesthesiology

## 2014-10-13 ENCOUNTER — Encounter (HOSPITAL_COMMUNITY): Payer: Self-pay | Admitting: *Deleted

## 2014-10-13 ENCOUNTER — Ambulatory Visit (HOSPITAL_COMMUNITY)
Admission: RE | Admit: 2014-10-13 | Discharge: 2014-10-14 | Disposition: A | Discharge status: Home / Self Care | Payer: Commercial Managed Care - HMO | Source: Ambulatory Visit | Attending: Specialist | Admitting: Specialist

## 2014-10-13 ENCOUNTER — Encounter (HOSPITAL_COMMUNITY)
Admission: RE | Disposition: A | Discharge status: Home / Self Care | Payer: Self-pay | Source: Ambulatory Visit | Attending: Specialist

## 2014-10-13 DIAGNOSIS — Z87891 Personal history of nicotine dependence: Secondary | ICD-10-CM | POA: Diagnosis not present

## 2014-10-13 DIAGNOSIS — K219 Gastro-esophageal reflux disease without esophagitis: Secondary | ICD-10-CM | POA: Diagnosis not present

## 2014-10-13 DIAGNOSIS — M549 Dorsalgia, unspecified: Secondary | ICD-10-CM

## 2014-10-13 DIAGNOSIS — M48061 Spinal stenosis, lumbar region without neurogenic claudication: Secondary | ICD-10-CM | POA: Diagnosis present

## 2014-10-13 DIAGNOSIS — M5136 Other intervertebral disc degeneration, lumbar region: Secondary | ICD-10-CM | POA: Insufficient documentation

## 2014-10-13 DIAGNOSIS — E039 Hypothyroidism, unspecified: Secondary | ICD-10-CM | POA: Insufficient documentation

## 2014-10-13 DIAGNOSIS — M4806 Spinal stenosis, lumbar region: Secondary | ICD-10-CM | POA: Insufficient documentation

## 2014-10-13 DIAGNOSIS — Z880 Allergy status to penicillin: Secondary | ICD-10-CM | POA: Diagnosis not present

## 2014-10-13 DIAGNOSIS — M5126 Other intervertebral disc displacement, lumbar region: Secondary | ICD-10-CM | POA: Insufficient documentation

## 2014-10-13 HISTORY — PX: LUMBAR LAMINECTOMY/DECOMPRESSION MICRODISCECTOMY: SHX5026

## 2014-10-13 SURGERY — LUMBAR LAMINECTOMY/DECOMPRESSION MICRODISCECTOMY 1 LEVEL
Anesthesia: General | Laterality: Left

## 2014-10-13 MED ORDER — DEXAMETHASONE SODIUM PHOSPHATE 10 MG/ML IJ SOLN
INTRAMUSCULAR | Status: AC
  Filled 2014-10-13: qty 1

## 2014-10-13 MED ORDER — PROMETHAZINE HCL 25 MG/ML IJ SOLN: 6.2500 mg | INTRAMUSCULAR | Status: DC | PRN

## 2014-10-13 MED ORDER — ACETAMINOPHEN 325 MG PO TABS: 650.0000 mg | ORAL_TABLET | ORAL | Status: DC | PRN

## 2014-10-13 MED ORDER — HYDROCODONE-ACETAMINOPHEN 5-325 MG PO TABS
1.0000 | ORAL_TABLET | ORAL | Status: DC | PRN
  Administered 2014-10-13 – 2014-10-14 (×3): 2 via ORAL
  Filled 2014-10-13 (×3): qty 2

## 2014-10-13 MED ORDER — DEXAMETHASONE SODIUM PHOSPHATE 10 MG/ML IJ SOLN
INTRAMUSCULAR | Status: DC | PRN
  Administered 2014-10-13: 10 mg via INTRAVENOUS

## 2014-10-13 MED ORDER — MIDAZOLAM HCL 5 MG/5ML IJ SOLN
INTRAMUSCULAR | Status: DC | PRN
  Administered 2014-10-13: 2 mg via INTRAVENOUS

## 2014-10-13 MED ORDER — NEOSTIGMINE METHYLSULFATE 10 MG/10ML IV SOLN
INTRAVENOUS | Status: DC | PRN
  Administered 2014-10-13: 4 mg via INTRAVENOUS

## 2014-10-13 MED ORDER — SUCCINYLCHOLINE CHLORIDE 20 MG/ML IJ SOLN
INTRAMUSCULAR | Status: DC | PRN
  Administered 2014-10-13: 100 mg via INTRAVENOUS

## 2014-10-13 MED ORDER — PROPOFOL 10 MG/ML IV BOLUS
INTRAVENOUS | Status: DC | PRN
  Administered 2014-10-13: 200 mg via INTRAVENOUS

## 2014-10-13 MED ORDER — LACTATED RINGERS IV SOLN
INTRAVENOUS | Status: DC
  Administered 2014-10-13: 13:00:00 via INTRAVENOUS
  Administered 2014-10-13: 1000 mL via INTRAVENOUS

## 2014-10-13 MED ORDER — LIDOCAINE HCL (CARDIAC) 20 MG/ML IV SOLN
INTRAVENOUS | Status: AC
  Filled 2014-10-13: qty 5

## 2014-10-13 MED ORDER — FENTANYL CITRATE (PF) 250 MCG/5ML IJ SOLN
INTRAMUSCULAR | Status: AC
  Filled 2014-10-13: qty 25

## 2014-10-13 MED ORDER — ZOLPIDEM TARTRATE 5 MG PO TABS
5.0000 mg | ORAL_TABLET | Freq: Every evening | ORAL | Status: DC | PRN
  Administered 2014-10-14: 5 mg via ORAL
  Filled 2014-10-13: qty 1

## 2014-10-13 MED ORDER — CEFAZOLIN SODIUM-DEXTROSE 2-3 GM-% IV SOLR
INTRAVENOUS | Status: AC
  Filled 2014-10-13: qty 50

## 2014-10-13 MED ORDER — SODIUM CHLORIDE 0.9 % IR SOLN
Status: DC | PRN
  Administered 2014-10-13: 500 mL

## 2014-10-13 MED ORDER — ALUM & MAG HYDROXIDE-SIMETH 200-200-20 MG/5ML PO SUSP: 30.0000 mL | Freq: Four times a day (QID) | ORAL | Status: DC | PRN

## 2014-10-13 MED ORDER — LACTATED RINGERS IV SOLN: INTRAVENOUS | Status: DC

## 2014-10-13 MED ORDER — PROPOFOL 10 MG/ML IV BOLUS
INTRAVENOUS | Status: AC
  Filled 2014-10-13: qty 20

## 2014-10-13 MED ORDER — LEVOTHYROXINE SODIUM 75 MCG PO TABS
75.0000 ug | ORAL_TABLET | Freq: Every day | ORAL | Status: DC
  Administered 2014-10-14: 75 ug via ORAL
  Filled 2014-10-13 (×2): qty 1

## 2014-10-13 MED ORDER — PHENOL 1.4 % MT LIQD: 1.0000 | OROMUCOSAL | Status: DC | PRN

## 2014-10-13 MED ORDER — MEPERIDINE HCL 50 MG/ML IJ SOLN: 6.2500 mg | INTRAMUSCULAR | Status: DC | PRN

## 2014-10-13 MED ORDER — MIDAZOLAM HCL 2 MG/2ML IJ SOLN
INTRAMUSCULAR | Status: AC
  Filled 2014-10-13: qty 2

## 2014-10-13 MED ORDER — ROCURONIUM BROMIDE 100 MG/10ML IV SOLN
INTRAVENOUS | Status: AC
  Filled 2014-10-13: qty 1

## 2014-10-13 MED ORDER — LIDOCAINE HCL (CARDIAC) 20 MG/ML IV SOLN
INTRAVENOUS | Status: DC | PRN
  Administered 2014-10-13: 80 mg via INTRAVENOUS

## 2014-10-13 MED ORDER — RISAQUAD PO CAPS
1.0000 | ORAL_CAPSULE | Freq: Every day | ORAL | Status: DC
  Administered 2014-10-13: 1 via ORAL
  Filled 2014-10-13 (×2): qty 1

## 2014-10-13 MED ORDER — OXYCODONE-ACETAMINOPHEN 5-325 MG PO TABS: 1.0000 | ORAL_TABLET | ORAL | Status: DC | PRN

## 2014-10-13 MED ORDER — METHOCARBAMOL 500 MG PO TABS: 500.0000 mg | ORAL_TABLET | Freq: Four times a day (QID) | ORAL | Status: DC | PRN

## 2014-10-13 MED ORDER — BISACODYL 5 MG PO TBEC: 5.0000 mg | DELAYED_RELEASE_TABLET | Freq: Every day | ORAL | Status: DC | PRN

## 2014-10-13 MED ORDER — METHOCARBAMOL 1000 MG/10ML IJ SOLN
500.0000 mg | Freq: Four times a day (QID) | INTRAVENOUS | Status: DC | PRN
  Administered 2014-10-13: 500 mg via INTRAVENOUS
  Filled 2014-10-13 (×2): qty 5

## 2014-10-13 MED ORDER — SENNOSIDES-DOCUSATE SODIUM 8.6-50 MG PO TABS: 1.0000 | ORAL_TABLET | Freq: Every evening | ORAL | Status: DC | PRN

## 2014-10-13 MED ORDER — SODIUM CHLORIDE 0.9 % IR SOLN
Status: AC
  Filled 2014-10-13: qty 1

## 2014-10-13 MED ORDER — MAGNESIUM CITRATE PO SOLN
1.0000 | Freq: Once | ORAL | Status: DC | PRN
Start: 2014-10-13 — End: 2014-10-14

## 2014-10-13 MED ORDER — CEFAZOLIN SODIUM-DEXTROSE 2-3 GM-% IV SOLR
2.0000 g | INTRAVENOUS | Status: AC
  Administered 2014-10-13: 2 g via INTRAVENOUS

## 2014-10-13 MED ORDER — HYDROMORPHONE HCL 1 MG/ML IJ SOLN: 0.5000 mg | INTRAMUSCULAR | Status: DC | PRN

## 2014-10-13 MED ORDER — DOCUSATE SODIUM 100 MG PO CAPS
100.0000 mg | ORAL_CAPSULE | Freq: Two times a day (BID) | ORAL | Status: DC
Start: 2014-10-13 — End: 2014-10-14
  Administered 2014-10-13: 100 mg via ORAL

## 2014-10-13 MED ORDER — KCL IN DEXTROSE-NACL 20-5-0.45 MEQ/L-%-% IV SOLN
INTRAVENOUS | Status: DC
  Administered 2014-10-13: 18:00:00 via INTRAVENOUS
  Filled 2014-10-13 (×2): qty 1000

## 2014-10-13 MED ORDER — PAROXETINE HCL 30 MG PO TABS
30.0000 mg | ORAL_TABLET | Freq: Every day | ORAL | Status: DC
  Filled 2014-10-13: qty 1

## 2014-10-13 MED ORDER — ROCURONIUM BROMIDE 100 MG/10ML IV SOLN
INTRAVENOUS | Status: DC | PRN
  Administered 2014-10-13: 30 mg via INTRAVENOUS
  Administered 2014-10-13: 20 mg via INTRAVENOUS

## 2014-10-13 MED ORDER — DOCUSATE SODIUM 100 MG PO CAPS: 100.0000 mg | ORAL_CAPSULE | Freq: Two times a day (BID) | ORAL | Status: DC | PRN

## 2014-10-13 MED ORDER — HYDROMORPHONE HCL 1 MG/ML IJ SOLN
0.2500 mg | INTRAMUSCULAR | Status: DC | PRN
  Administered 2014-10-13 (×3): 0.5 mg via INTRAVENOUS

## 2014-10-13 MED ORDER — GLYCOPYRROLATE 0.2 MG/ML IJ SOLN
INTRAMUSCULAR | Status: DC | PRN
  Administered 2014-10-13: .8 mg via INTRAVENOUS

## 2014-10-13 MED ORDER — BUPIVACAINE-EPINEPHRINE (PF) 0.5% -1:200000 IJ SOLN
INTRAMUSCULAR | Status: AC
  Filled 2014-10-13: qty 30

## 2014-10-13 MED ORDER — ONDANSETRON HCL 4 MG/2ML IJ SOLN
INTRAMUSCULAR | Status: AC
  Filled 2014-10-13: qty 2

## 2014-10-13 MED ORDER — MENTHOL 3 MG MT LOZG
1.0000 | LOZENGE | OROMUCOSAL | Status: DC | PRN
Start: 2014-10-13 — End: 2014-10-14

## 2014-10-13 MED ORDER — ONDANSETRON HCL 4 MG/2ML IJ SOLN
INTRAMUSCULAR | Status: DC | PRN
  Administered 2014-10-13: 4 mg via INTRAVENOUS

## 2014-10-13 MED ORDER — PANTOPRAZOLE SODIUM 20 MG PO TBEC
20.0000 mg | DELAYED_RELEASE_TABLET | Freq: Every day | ORAL | Status: DC
  Filled 2014-10-13: qty 1

## 2014-10-13 MED ORDER — GLYCOPYRROLATE 0.2 MG/ML IJ SOLN
INTRAMUSCULAR | Status: AC
  Filled 2014-10-13: qty 3

## 2014-10-13 MED ORDER — FENTANYL CITRATE (PF) 250 MCG/5ML IJ SOLN
INTRAMUSCULAR | Status: DC | PRN
Start: 2014-10-13 — End: 2014-10-13
  Administered 2014-10-13 (×5): 50 ug via INTRAVENOUS

## 2014-10-13 MED ORDER — BUPIVACAINE-EPINEPHRINE 0.5% -1:200000 IJ SOLN
INTRAMUSCULAR | Status: AC
  Filled 2014-10-13: qty 1

## 2014-10-13 MED ORDER — BUPIVACAINE-EPINEPHRINE 0.5% -1:200000 IJ SOLN
INTRAMUSCULAR | Status: DC | PRN
  Administered 2014-10-13: 16 mL

## 2014-10-13 MED ORDER — ACETAMINOPHEN 650 MG RE SUPP: 650.0000 mg | RECTAL | Status: DC | PRN

## 2014-10-13 MED ORDER — GLYCOPYRROLATE 0.2 MG/ML IJ SOLN
INTRAMUSCULAR | Status: AC
  Filled 2014-10-13: qty 1

## 2014-10-13 MED ORDER — METHOCARBAMOL 500 MG PO TABS: 500.0000 mg | ORAL_TABLET | Freq: Three times a day (TID) | ORAL | Status: DC

## 2014-10-13 MED ORDER — HYDROMORPHONE HCL 1 MG/ML IJ SOLN
INTRAMUSCULAR | Status: AC
  Filled 2014-10-13: qty 1

## 2014-10-13 MED ORDER — CEFAZOLIN SODIUM-DEXTROSE 2-3 GM-% IV SOLR
2.0000 g | Freq: Three times a day (TID) | INTRAVENOUS | Status: DC
  Administered 2014-10-13 – 2014-10-14 (×2): 2 g via INTRAVENOUS
  Filled 2014-10-13 (×3): qty 50

## 2014-10-13 MED ORDER — THROMBIN 5000 UNITS EX SOLR
CUTANEOUS | Status: AC
  Filled 2014-10-13: qty 10000

## 2014-10-13 MED ORDER — MECLIZINE HCL 25 MG PO TABS
25.0000 mg | ORAL_TABLET | Freq: Four times a day (QID) | ORAL | Status: DC | PRN
  Filled 2014-10-13: qty 1

## 2014-10-13 MED ORDER — ONDANSETRON HCL 4 MG/2ML IJ SOLN: 4.0000 mg | INTRAMUSCULAR | Status: DC | PRN

## 2014-10-13 SURGICAL SUPPLY — 49 items
BAG SPEC THK2 15X12 ZIP CLS (MISCELLANEOUS)
BAG ZIPLOCK 12X15 (MISCELLANEOUS) IMPLANT
CLEANER TIP ELECTROSURG 2X2 (MISCELLANEOUS) ×3 IMPLANT
CLOSURE WOUND 1/2 X4 (GAUZE/BANDAGES/DRESSINGS) ×1
CLOTH 2% CHLOROHEXIDINE 3PK (PERSONAL CARE ITEMS) ×3 IMPLANT
DRAPE MICROSCOPE LEICA (MISCELLANEOUS) ×3 IMPLANT
DRAPE POUCH INSTRU U-SHP 10X18 (DRAPES) ×3 IMPLANT
DRAPE SHEET LG 3/4 BI-LAMINATE (DRAPES) ×3 IMPLANT
DRAPE SURG 17X11 SM STRL (DRAPES) ×3 IMPLANT
DRAPE UTILITY XL STRL (DRAPES) ×3 IMPLANT
DRSG AQUACEL AG ADV 3.5X 4 (GAUZE/BANDAGES/DRESSINGS) ×2 IMPLANT
DRSG AQUACEL AG ADV 3.5X 6 (GAUZE/BANDAGES/DRESSINGS) IMPLANT
DURAPREP 26ML APPLICATOR (WOUND CARE) ×3 IMPLANT
DURASEAL SPINE SEALANT 3ML (MISCELLANEOUS) IMPLANT
ELECT BLADE TIP CTD 4 INCH (ELECTRODE) ×2 IMPLANT
ELECT REM PT RETURN 9FT ADLT (ELECTROSURGICAL) ×3
ELECTRODE REM PT RTRN 9FT ADLT (ELECTROSURGICAL) ×1 IMPLANT
GLOVE BIOGEL PI IND STRL 7.0 (GLOVE) ×1 IMPLANT
GLOVE BIOGEL PI INDICATOR 7.0 (GLOVE) ×2
GLOVE SURG SS PI 7.0 STRL IVOR (GLOVE) ×3 IMPLANT
GLOVE SURG SS PI 7.5 STRL IVOR (GLOVE) ×1 IMPLANT
GLOVE SURG SS PI 8.0 STRL IVOR (GLOVE) ×6 IMPLANT
GOWN STRL REUS W/TWL XL LVL3 (GOWN DISPOSABLE) ×6 IMPLANT
IV CATH 14GX2 1/4 (CATHETERS) ×3 IMPLANT
KIT BASIN OR (CUSTOM PROCEDURE TRAY) ×3 IMPLANT
KIT POSITIONING SURG ANDREWS (MISCELLANEOUS) ×3 IMPLANT
MANIFOLD NEPTUNE II (INSTRUMENTS) ×3 IMPLANT
NDL SPNL 18GX3.5 QUINCKE PK (NEEDLE) ×2 IMPLANT
NEEDLE SPNL 18GX3.5 QUINCKE PK (NEEDLE) ×6 IMPLANT
PACK LAMINECTOMY ORTHO (CUSTOM PROCEDURE TRAY) ×3 IMPLANT
PATTIES SURGICAL .5 X.5 (GAUZE/BANDAGES/DRESSINGS) IMPLANT
PATTIES SURGICAL .75X.75 (GAUZE/BANDAGES/DRESSINGS) ×2 IMPLANT
PATTIES SURGICAL 1X1 (DISPOSABLE) ×2 IMPLANT
RUBBERBAND STERILE (MISCELLANEOUS) ×6 IMPLANT
SPONGE SURGIFOAM ABS GEL 100 (HEMOSTASIS) ×3 IMPLANT
STAPLER VISISTAT (STAPLE) IMPLANT
STRIP CLOSURE SKIN 1/2X4 (GAUZE/BANDAGES/DRESSINGS) ×2 IMPLANT
SUT NURALON 4 0 TR CR/8 (SUTURE) IMPLANT
SUT PROLENE 3 0 PS 2 (SUTURE) ×3 IMPLANT
SUT VIC AB 1 CT1 27 (SUTURE) ×3
SUT VIC AB 1 CT1 27XBRD ANTBC (SUTURE) IMPLANT
SUT VIC AB 1-0 CT2 27 (SUTURE) ×3 IMPLANT
SUT VIC AB 2-0 CT1 27 (SUTURE) ×3
SUT VIC AB 2-0 CT1 TAPERPNT 27 (SUTURE) IMPLANT
SUT VIC AB 2-0 CT2 27 (SUTURE) ×3 IMPLANT
SYR 3ML LL SCALE MARK (SYRINGE) ×2 IMPLANT
TOWEL OR 17X26 10 PK STRL BLUE (TOWEL DISPOSABLE) ×3 IMPLANT
TOWEL OR NON WOVEN STRL DISP B (DISPOSABLE) ×2 IMPLANT
YANKAUER SUCT BULB TIP NO VENT (SUCTIONS) ×2 IMPLANT

## 2014-10-13 NOTE — Op Note (Signed)
NAMEWELLES, WALTHALL NO.:  [id number]  MEDICAL RECORD NO.:  [id number]  LOCATION:  1602                         FACILITY:  [facility]  PHYSICIAN:  Jene Every, M.D.    DATE OF BIRTH:  Jun 16, 1970  DATE OF PROCEDURE:  10/13/2014 DATE OF DISCHARGE:                              OPERATIVE REPORT   PREOPERATIVE DIAGNOSES:  Spinal stenosis, herniated nucleus pulposus of L5-S1 left.  POSTOPERATIVE DIAGNOSIS:  Spinal stenosis, herniated nucleus pulposus of L5-S1 left.  PROCEDURES PERFORMED: 1. Microlumbar decompression, L5-S1 left. 2. Foraminotomies, L5-S1 left.  ANESTHESIA:  General.  ASSISTANT:  Lanna Poche, PA.  HISTORY:  A 44 year old with left lower extremity radicular pain secondary to disk herniation, lateral recess stenosis, neural tension signs, diminished Achilles reflex, diminished plantar flexion, altered sensation S1 dermatome indicated for decompression at L5-S1 disk.  Risk and benefits were discussed including bleeding, infection, damage to neurovascular structures, no change in symptoms, worsening symptoms, DVT, PE, anesthetic complications, etc.  TECHNIQUE:  With the patient in supine position, after induction of adequate general anesthesia, 2 g Kefzol, placed prone on the [place] frame.  All bony prominences well padded.  Lumbar region was prepped and draped in usual sterile fashion.  Two 18-gauge spinal needles were utilized to localize the 5-1 interspace, confirmed with x-ray.  Incision was made from spinous process L5-S1, subcutaneous tissue was dissected. Electrocautery was utilized to achieve hemostasis.  Dorsolumbar fascia identified and divided in line with skin incision.  Paraspinous muscle elevated from lamina 5 and 1.  McCullough retractors placed.  Operating microscope was draped and brought on the surgical field.  Confirmatory radiograph obtained.  Straight curette was utilized, detached ligamentum flavum from the cephalad  edge of S1.  A neural patty placed beneath the ligamentum flavum, foraminotomy of S1 was performed.  Ligamentum flavum removed from the interspace.  The S1 nerve root was noted to be compressed in the lateral recess, erythematous and edematous.  We protected the S1 nerve root gently mobilizing it medially.  Decompressed the lateral recess.  The medial border of the pedicle had a hypertrophic facet.  A combination of disc herniation and tethering epidural venous plexus was compressing the S1 nerve root in addition to the facet and ligamentum flavum hypertrophy.  Cauterized and lysed epidural venous plexus further mobilizing the S1 nerve root.  Focal HNP was noted at L5- S1.  I performed an annulotomy.  Copious portion of disk material was removed from the disk space with a straight pituitary.  Further mobilized with a nerve hook and antibiotic irrigation and multiple fragments were retrieved.  Following full diskectomy of herniated material and foraminotomy of L5, no residual disk herniation noted.  The S1 nerve root was found to be erythematous and edematous, but there was 1 cm of excursion the S1 nerve root medial pedicle without tension. Neural probe passed freely at the foramen of L5 and S1.  No active bleeding or CSF leakage.  Therefore, I removed the [facility] retractor. Obtained the final confirmatory radiograph at 5-1.  No active bleeding. We closed the dorsolumbar fascia with 1 Vicryl, subcu with 2- 0, and skin subcuticular Prolene.  Sterile dressing applied.  Placed  supine on the hospital bed, extubated without difficulty, and transported to the recovery room in satisfactory condition.  The patient tolerated the procedure well.  No complications.  Assistant, Lanna Poche, [place].  Minimal blood loss.     Jene Every, M.D.     Cordelia Pen  D:  10/13/2014  T:  10/13/2014  Job:  [id number]

## 2014-10-13 NOTE — Interval H&P Note (Signed)
History and Physical Interval Note:  10/13/2014 7:28 AM  Nicholas Mckinney  has presented today for surgery, with the diagnosis of STENOSIS HNP L5-S1 ON LEFT  The various methods of treatment have been discussed with the patient and family. After consideration of risks, benefits and other options for treatment, the patient has consented to  Procedure(s): MICRO LUMBAR DECOMPRESSION L5-S1 ON LEFT (Left) as a surgical intervention .  The patient's history has been reviewed, patient examined, no change in status, stable for surgery.  I have reviewed the patient's chart and labs.  Questions were answered to the patient's satisfaction.     BEANE,JEFFREY C

## 2014-10-13 NOTE — H&P (View-Only) (Signed)
Nicholas Mckinney is an 44 y.o. male.   Chief Complaint: back and L leg pain HPI: The patient is a 44 year old male who presents with back pain. The patient is here today in referral from Dr. Ramos. The patient reports low back symptoms including pain which began 8 year(s) ago without any known injury. and Symptoms include numbness (left foot), burning (left low back) and tingling (left leg). The pain radiates to the left buttock and left posterior thigh. The patient describes the pain as sharp. The patient describes the severity of their symptoms as 4 / 10. Symptoms are exacerbated by sitting. Current treatment includes nonsteroidal anti-inflammatory drugs (Ibuprofen), opioid analgesics (Norco), ice packs and heating pad. Prior to being seen today the patient was previously evaluated by Dr. Ramos. Past evaluation has included MRI of the lumbar spine. Past treatment has included nonsteroidal anti-inflammatory drugs, opioid analgesics and Left S1 SNRB.  His MRI 09/16/2014 demonstrates moderate disc degeneration at L4-5 and at L5-S1, mild at L3-4. He does have a paracentral disc protrusion at L5-S1 to the left with slight extrusion. There is associated impingement of left S1 nerve root in the lateral recess secondary to facet hypertrophy and the disc protrusion. It is on image 21 of 31, series 5 of the axial images. T2, the right at L4-5, he has a disc protrusion on the right at L4-5 into the foramen of L4 on the right, but no impingement on the L5 root. L3-4, no disc protrusion noted. He also has mild foraminal stenosis of L5 on the left secondary to facet hypertrophy.  Past Medical History  Diagnosis Date  . Ruptured lumbar disc     Past Surgical History  Procedure Laterality Date  . Spleenectomy    . Arthroscopic knee surgery    . Elbow surgery    . Extraction of wisdom teeth      No family history on file. Social History:  reports that he has quit smoking. His smokeless tobacco use includes Snuff  and Chew. He reports that he drinks alcohol. He reports that he does not use illicit drugs.  Allergies:  Allergies  Allergen Reactions  . Penicillins     All cillins     (Not in a hospital admission)  No results found for this or any previous visit (from the past 48 hour(s)). No results found.  Review of Systems  Constitutional: Negative.   HENT: Negative.   Eyes: Negative.   Respiratory: Negative.   Cardiovascular: Negative.   Gastrointestinal: Negative.   Genitourinary: Negative.   Musculoskeletal: Positive for back pain.  Skin: Negative.   Neurological: Positive for sensory change and focal weakness.  Psychiatric/Behavioral: Negative.     There were no vitals taken for this visit. Physical Exam  Constitutional: He is oriented to person, place, and time. He appears well-developed. He appears distressed.  HENT:  Head: Normocephalic and atraumatic.  Eyes: Pupils are equal, round, and reactive to light.  Neck: Normal range of motion.  Cardiovascular: Normal rate.   Respiratory: Effort normal.  GI: Soft.  Musculoskeletal:  On exam, he is in moderate distress, mood and affect are appropriate. He walks with an antalgic gait. He is tender in the left proximal gluteus. He has pain in forward flexion and extension of lumbar spine. Straight leg raise, buttock, thigh and calf pain. He has decreased plantar flexion on the left, decreased Achilles reflex, decreased sensation in S1 dermatome. EHLs 5+ to 4/5, left compared to the right.  Lumbar spine   exam reveals no evidence of soft tissue swelling, deformity or skin ecchymosis. On palpation there is no tenderness of the lumbar spine. No flank pain with percussion. The abdomen is soft and nontender. Nontender over the trochanters. No cellulitis or lymphadenopathy.  Good range of motion of the lumbar spine without associated pain. Straight leg raise is negative. Motor is 5/5 including EHL, tibialis anterior, plantar flexion, quadriceps  and hamstrings. Patient is normoreflexic. There is no Babinski or clonus. Sensory exam is intact to light touch. Patient has good distal pulses. No DVT. No pain and normal range of motion without instability of the hips, knees and ankles.  Neurological: He is alert and oriented to person, place, and time.    X-ray three-view demonstrates disc degeneration at L4-5, L5-S1. No instability on flexion and extension.  MRI 09/16/2014 demonstrates a disc herniation paracentral left with facet hypertrophy and disc space narrowing contributing to spinal stenosis and S1 nerve root compression.  Assessment/Plan 1. Chronic S1 radiculopathy secondary to disc herniation, lateral recess stenosis, myotome weakness, dermatomal dysesthesia refractory, chronic narcotic analgesics, unable to work. 2. Disc degeneration multiple levels, predominantly L4-5, L5-S1.  I had an extensive discussion with Nicholas Mckinney concerning currently pathology going over treatment options. Two options: One live with the symptoms, continue pain management strategies, avoid re-injury. Two would be to consider lumbar decompression at L5-S1 on the left to decompress the S1 nerve root in the lateral recess to addresses S1 radicular pain. I did indicate that would not fix the disc or alleviate his discogenic pain, but he seems more symptomatic from his buttock and leg. Discussed ultimately the option for a two-level fusion. They are not interested in that at this point in time. I therefore feel it is reasonable, given his persistent symptoms refractory to conservative treatment, where we can decompress lateral recess and alleviate pressure on the S1 nerve root. 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 We discussed residual back symptoms requiring disc pressure management and possibility of a recurrent disc herniation and fusion in the future. No history of MRSA. He is not  a smoker. He chews tobacco. Continue with his current analgesics that he is taking. He remains out of work. I discussed this with his wife. Spent considerable time discussing these issues, reviewing the pathology, relevant anatomy and treatment options. Again, we will proceed accordingly.  Plan microlumbar decompression L5-S1  BISSELL, JACLYN M. PA-C for Dr. Beane 10/05/2014, 5:26 PM    

## 2014-10-13 NOTE — Anesthesia Procedure Notes (Signed)
Procedure Name: Intubation Date/Time: 10/13/2014 11:38 AM Performed by: Jarvis Newcomer A Pre-anesthesia Checklist: Patient identified, Timeout performed, Emergency Drugs available, Suction available and Patient being monitored Patient Re-evaluated:Patient Re-evaluated prior to inductionOxygen Delivery Method: Circle system utilized Preoxygenation: Pre-oxygenation with 100% oxygen Intubation Type: IV induction Ventilation: Mask ventilation without difficulty and Oral airway inserted - appropriate to patient size Laryngoscope Size: Mac and 4 Grade View: Grade I Tube type: Oral Tube size: 7.5 mm Number of attempts: 1 Airway Equipment and Method: Stylet Placement Confirmation: breath sounds checked- equal and bilateral,  ETT inserted through vocal cords under direct vision and positive ETCO2 Secured at: 23 cm Tube secured with: Tape Dental Injury: Teeth and Oropharynx as per pre-operative assessment

## 2014-10-13 NOTE — Evaluation (Signed)
Physical Therapy Evaluation Patient Details Name: Nicholas Mckinney MRN: [id number] DOB: Feb 11, 1970 Today's Date: 10/13/2014   History of Present Illness  MICRO LUMBAR DECOMPRESSION L5-S1 ON LEFT (Left  Clinical Impression  Patient states" this is th best  I have felt in 8 years.". Patient mobilizing well. Eager to ambulate. Patient will benefit from PT to address problems listed in note below.    Follow Up Recommendations No PT follow up    Equipment Recommendations  None recommended by PT    Recommendations for Other Services       Precautions / Restrictions Precautions Precautions: Back Precaution Comments: handout, reviewed back precautions      Mobility  Bed Mobility Overal bed mobility: Needs Assistance Bed Mobility: Rolling;Sidelying to Sit Rolling: Supervision Sidelying to sit: Supervision       General bed mobility comments: cues for technique  Transfers Overall transfer level: Needs assistance   Transfers: Sit to/from Stand Sit to Stand: Supervision         General transfer comment: cues for hand placement  Ambulation/Gait Ambulation/Gait assistance: Supervision Ambulation Distance (Feet): 200 Feet         General Gait Details: IV pole, cues for turns  Stairs            Wheelchair Mobility    Modified Rankin (Stroke Patients Only)       Balance                                             Pertinent Vitals/Pain Pain Assessment: 0-10 Pain Score: 2  Pain Location: back incision Pain Descriptors / Indicators: Discomfort;Tightness Pain Intervention(s): Premedicated before session    Home Living Family/patient expects to be discharged to:: Private residence Living Arrangements: Spouse/significant other Available Help at Discharge: Family Type of Home: House Home Access: Stairs to enter Entrance Stairs-Rails: Right Entrance Stairs-Number of Steps: 6 Home Layout: One level Home Equipment: None      Prior  Function Level of Independence: Independent               Hand Dominance        Extremity/Trunk Assessment               Lower Extremity Assessment: Overall WFL for tasks assessed      Cervical / Trunk Assessment: Normal  Communication   Communication: No difficulties  Cognition Arousal/Alertness: Awake/alert Behavior During Therapy: WFL for tasks assessed/performed Overall Cognitive Status: Within Functional Limits for tasks assessed                      General Comments      Exercises        Assessment/Plan    PT Assessment Patient needs continued PT services  PT Diagnosis Difficulty walking;Acute pain   PT Problem List Decreased activity tolerance;Decreased mobility;Pain  PT Treatment Interventions Gait training;Stair training;Functional mobility training;Patient/family education   PT Goals (Current goals can be found in the Care Plan section) Acute Rehab PT Goals Patient Stated Goal: to walk without pain PT Goal Formulation: With patient/family Time For Goal Achievement: 10/15/14 Potential to Achieve Goals: Good    Frequency Min 6X/week   Barriers to discharge        Co-evaluation               End of Session   Activity Tolerance: Patient tolerated treatment  well Patient left: in chair;with call bell/phone within reach;with chair alarm set;with family/visitor present Nurse Communication: Mobility status    Functional Assessment Tool Used: clinical judgement Functional Limitation: Mobility: Walking and moving around Mobility: Walking and Moving Around Current Status (B1478): At least 1 percent but less than 20 percent impaired, limited or restricted Mobility: Walking and Moving Around Goal Status [phone]): 0 percent impaired, limited or restricted    Time: 1308-6578 PT Time Calculation (min) (ACUTE ONLY): 21 min   Charges:   PT Evaluation $Initial PT Evaluation Tier I: 1 Procedure     PT G Codes:   PT G-Codes **NOT  FOR INPATIENT CLASS** Functional Assessment Tool Used: clinical judgement Functional Limitation: Mobility: Walking and moving around Mobility: Walking and Moving Around Current Status (I6962): At least 1 percent but less than 20 percent impaired, limited or restricted Mobility: Walking and Moving Around Goal Status [id number]): 0 percent impaired, limited or restricted    [name] 10/13/2014, 6:27 PM

## 2014-10-13 NOTE — Anesthesia Preprocedure Evaluation (Addendum)
Anesthesia Evaluation  Patient identified by MRN, date of birth, ID band Patient awake    Reviewed: Allergy & Precautions, NPO status , Patient's Chart, lab work & pertinent test results  Airway Mallampati: II  TM Distance: >3 FB Neck ROM: Full    Dental  (+) Teeth Intact   Pulmonary former smoker,    breath sounds clear to auscultation       Cardiovascular negative cardio ROS   Rhythm:Regular Rate:Normal     Neuro/Psych negative neurological ROS  negative psych ROS   GI/Hepatic Neg liver ROS, GERD  Medicated,  Endo/Other  Hypothyroidism   Renal/GU negative Renal ROS  negative genitourinary   Musculoskeletal negative musculoskeletal ROS (+)   Abdominal   Peds negative pediatric ROS (+)  Hematology   Anesthesia Other Findings   Reproductive/Obstetrics negative OB ROS                           Lab Results  Component Value Date   WBC 8.5 10/06/2014   HGB 14.9 10/06/2014   HCT 44.3 10/06/2014   MCV 92.7 10/06/2014   PLT 463* 10/06/2014   Lab Results  Component Value Date   CREATININE 1.07 10/06/2014   BUN 23* 10/06/2014   NA 137 10/06/2014   K 4.4 10/06/2014   CL 102 10/06/2014   CO2 27 10/06/2014   No results found for: INR, PROTIME   Anesthesia Physical Anesthesia Plan  ASA: II  Anesthesia Plan: General   Post-op Pain Management:    Induction: Intravenous  Airway Management Planned: Oral ETT  Additional Equipment:   Intra-op Plan:   Post-operative Plan: Extubation in OR  Informed Consent: I have reviewed the patients History and Physical, chart, labs and discussed the procedure including the risks, benefits and alternatives for the proposed anesthesia with the patient or authorized representative who has indicated his/her understanding and acceptance.   Dental advisory given  Plan Discussed with: CRNA  Anesthesia Plan Comments:         Anesthesia  Quick Evaluation

## 2014-10-13 NOTE — Transfer of Care (Signed)
Immediate Anesthesia Transfer of Care Note  Patient: Nicholas Mckinney  Procedure(s) Performed: Procedure(s): MICRO LUMBAR DECOMPRESSION L5-S1 ON LEFT (Left)  Patient Location: PACU  Anesthesia Type:General  Level of Consciousness: awake, alert , oriented and patient cooperative  Airway & Oxygen Therapy: Patient Spontanous Breathing and Patient connected to face mask oxygen  Post-op Assessment: Report given to RN and Post -op Vital signs reviewed and stable  Post vital signs: Reviewed and stable  Last Vitals:  Filed Vitals:   10/13/14 0825  BP: 133/84  Pulse: 61  Temp: 36.8 C  Resp: 18    Complications: No apparent anesthesia complications

## 2014-10-13 NOTE — Anesthesia Postprocedure Evaluation (Signed)
  Anesthesia Post-op Note  Patient: Nicholas Mckinney  Procedure(s) Performed: Procedure(s): MICRO LUMBAR DECOMPRESSION L5-S1 ON LEFT (Left)  Patient Location: PACU  Anesthesia Type:General  Level of Consciousness: awake, alert  and oriented  Airway and Oxygen Therapy: Patient Spontanous Breathing  Post-op Pain: mild  Post-op Assessment: Post-op Vital signs reviewed and Patient's Cardiovascular Status Stable LLE Motor Response: Purposeful movement LLE Sensation: Full sensation RLE Motor Response: Purposeful movement RLE Sensation: Full sensation      Post-op Vital Signs: Reviewed and stable  Last Vitals:  Filed Vitals:   10/13/14 1504  BP: 128/90  Pulse: 69  Temp: 36.8 C  Resp: 16    Complications: No apparent anesthesia complications

## 2014-10-13 NOTE — Discharge Instructions (Signed)

## 2014-10-13 NOTE — Brief Op Note (Signed)
10/13/2014  12:49 PM  PATIENT:  Nicholas Mckinney  44 y.o. male  PRE-OPERATIVE DIAGNOSIS:  STENOSIS HNP L5-S1 ON LEFT  POST-OPERATIVE DIAGNOSIS:  STENOSIS HNP L5-S1 ON LEFT  PROCEDURE:  Procedure(s): MICRO LUMBAR DECOMPRESSION L5-S1 ON LEFT (Left)  SURGEON:  Surgeon(s) and Role:    * Jene Every, MD - Primary  PHYSICIAN ASSISTANT:   ASSISTANTS: Bissell   ANESTHESIA:   general  EBL:  Total I/O In: 1000 [I.V.:1000] Out: 50 [Blood:50]  BLOOD ADMINISTERED:none  DRAINS: none   LOCAL MEDICATIONS USED:  MARCAINE     SPECIMEN:  No Specimen  DISPOSITION OF SPECIMEN:  N/A  COUNTS:  YES  TOURNIQUET:  * No tourniquets in log *  DICTATION: .Other Dictation: Dictation Number [id number]  PLAN OF CARE: Admit for overnight observation  PATIENT DISPOSITION:  PACU - hemodynamically stable.   Delay start of Pharmacological VTE agent (>24hrs) due to surgical blood loss or risk of bleeding: yes

## 2014-10-14 ENCOUNTER — Encounter (HOSPITAL_COMMUNITY): Payer: Self-pay | Admitting: Specialist

## 2014-10-14 DIAGNOSIS — M5126 Other intervertebral disc displacement, lumbar region: Secondary | ICD-10-CM | POA: Diagnosis not present

## 2014-10-14 MED ORDER — DICLOFENAC SODIUM 75 MG PO TBEC: 75.0000 mg | DELAYED_RELEASE_TABLET | Freq: Every day | ORAL | Status: DC

## 2014-10-14 NOTE — Progress Notes (Signed)
Subjective: 1 Day Post-Op Procedure(s) (LRB): MICRO LUMBAR DECOMPRESSION L5-S1 ON LEFT (Left) Patient reports pain as mild.  Reports incisional back pain, well controlled. Some buttock pain, less severe than pre-op. Leg pain better. Feels the best he has in 8 years. No issues with voiding. Has been OOB with PT yesterday and this morning on his own with no issues. Feels ready for D/c home  Objective: Vital signs in last 24 hours: Temp:  [97.7 F (36.5 C)-98.7 F (37.1 C)] 98.1 F (36.7 C) (09/30 0635) Pulse Rate:  [58-97] 58 (09/30 0635) Resp:  [12-22] 16 (09/30 0635) BP: (113-133)/(54-90) 121/74 mmHg (09/30 0635) SpO2:  [93 %-100 %] 93 % (09/30 0635) Weight:  [101.606 kg (224 lb)-102 kg (224 lb 13.9 oz)] 102 kg (224 lb 13.9 oz) (09/29 1504)  Intake/Output from previous day: 09/29 0701 - 09/30 0700 In: 3345.5 [P.O.:1180; I.V.:2110.5; IV Piggyback:55] Out: 2275 [Urine:2225; Blood:50] Intake/Output this shift:    No results for input(s): HGB in the last 72 hours. No results for input(s): WBC, RBC, HCT, PLT in the last 72 hours. No results for input(s): NA, K, CL, CO2, BUN, CREATININE, GLUCOSE, CALCIUM in the last 72 hours. No results for input(s): LABPT, INR in the last 72 hours.  Neurologically intact ABD soft Neurovascular intact Sensation intact distally Intact pulses distally Dorsiflexion/Plantar flexion intact Incision: dressing C/D/I and no drainage No cellulitis present Compartment soft no sign of DVT  Assessment/Plan: 1 Day Post-Op Procedure(s) (LRB): MICRO LUMBAR DECOMPRESSION L5-S1 ON LEFT (Left) Advance diet Up with therapy D/C IV fluids  Discussed D/C instructions, dressing instructions, Lspine precautions D/C home today Follow up with Dr. Shelle Iron in 2 weeks for suture removal Will discuss with Dr. Elissa Lovett, JACLYN M. 10/14/2014, 8:16 AM

## 2014-10-14 NOTE — Evaluation (Signed)
Occupational Therapy One Time Evaluation Patient Details Name: Nicholas Mckinney MRN: [id number] DOB: 1970/06/11 Today's Date: 10/14/2014    History of Present Illness MICRO LUMBAR DECOMPRESSION L5-S1 ON LEFT (Left   Clinical Impression   Pt doing well. Needs overall supervision for safety with ADL to adhere to back precautions. He is min guard assist with tub transfer. Pt states he will obtain a 3in1 as recommended through family. All education completed and no further acute OT needs.    Follow Up Recommendations  No OT follow up;Supervision - Intermittent    Equipment Recommendations  None recommended by OT (pt plans to obtain 3in1 on his own)    Recommendations for Other Services       Precautions / Restrictions Precautions Precautions: Back Precaution Comments: pt has handout. reviewed all back precautions.      Mobility Bed Mobility               General bed mobility comments: pt up in room with family.  Transfers Overall transfer level: Needs assistance Equipment used: None Transfers: Sit to/from Stand Sit to Stand: Supervision         General transfer comment: cues for back precautions and hand placement.    Balance                                            ADL Overall ADL's : Needs assistance/impaired Eating/Feeding: Independent;Sitting   Grooming: Wash/dry hands;Supervision/safety;Standing   Upper Body Bathing: Supervision/ safety;Sitting   Lower Body Bathing: Supervison/ safety;Sit to/from stand;With adaptive equipment   Upper Body Dressing : Supervision/safety;Sitting   Lower Body Dressing: Supervision/safety;Sit to/from stand   Toilet Transfer: Supervision/safety;BSC   Toileting- Architect and Hygiene: Supervision/safety;Sitting/lateral lean   Tub/ Engineer, structural: Tub transfer;Min guard     General ADL Comments: Discussed LHS option for LB bathing in shower and pt plans to obtain one. He states he would  also like to get a reacher for picking up items. He did well with crossing LEs up for LB dressing. reviewed handout and all precautions. He would benefit from a 3in1 as he tends to twist to try to brace hand on wall and sit on comfort height commode. He states he can borrow a 3in1 or get one. Practiced side step over tub. Pt very close to twistng with trying to perform posterior peirarea hygiene so discussed a toilet aid and coverage/whrere to obtain to prevent twisting and pt verbalized understanding.      Vision     Perception     Praxis      Pertinent Vitals/Pain Pain Assessment: No/denies pain     Hand Dominance     Extremity/Trunk Assessment Upper Extremity Assessment Upper Extremity Assessment: Overall WFL for tasks assessed           Communication Communication Communication: No difficulties   Cognition Arousal/Alertness: Awake/alert Behavior During Therapy: WFL for tasks assessed/performed Overall Cognitive Status: Within Functional Limits for tasks assessed                     General Comments       Exercises       Shoulder Instructions      Home Living Family/patient expects to be discharged to:: Private residence Living Arrangements: Spouse/significant other Available Help at Discharge: Family Type of Home: House Home Access: Stairs to enter [employer] of  Steps: 6 Entrance Stairs-Rails: Right Home Layout: One level     Bathroom Shower/Tub: Chief Strategy Officer: Standard     Home Equipment: Bedside commode   Additional Comments: pt states he can borrow a 3in1. he states he plans to obtain a reacher and LHS      Prior Functioning/Environment Level of Independence: Independent             OT Diagnosis: Generalized weakness   OT Problem List:     OT Treatment/Interventions:      OT Goals(Current goals can be found in the care plan section) Acute Rehab OT Goals Patient Stated Goal: to continue to get  stronger. OT Goal Formulation: With patient  OT Frequency:     Barriers to D/C:            Co-evaluation              End of Session    Activity Tolerance: Patient tolerated treatment well Patient left:  (in room with family)   Time: 1610-9604 OT Time Calculation (min): 24 min Charges:  OT General Charges $OT Visit: 1 Procedure OT Evaluation $Initial OT Evaluation Tier I: 1 Procedure OT Treatments $Self Care/Home Management : 8-22 mins G-Codes: OT G-codes **NOT FOR INPATIENT CLASS** Functional Assessment Tool Used: clinical judgement Functional Limitation: Self care Self Care Current Status (V4098): At least 1 percent but less than 20 percent impaired, limited or restricted Self Care Goal Status (J1914): At least 1 percent but less than 20 percent impaired, limited or restricted Self Care Discharge Status [phone]): At least 1 percent but less than 20 percent impaired, limited or restricted  Lennox Laity  [phone] 10/14/2014, 9:52 AM

## 2014-10-14 NOTE — Discharge Summary (Signed)
Physician Discharge Summary   Patient ID: Nicholas Mckinney MRN: [id number] DOB/AGE: May 28, 1970 44 y.o.  Admit date: 10/13/2014 Discharge date: 10/14/2014  Primary Diagnosis:   STENOSIS HNP L5-S1 ON LEFT  Admission Diagnoses:  Past Medical History  Diagnosis Date  . Ruptured lumbar disc   . Myocarditis 1998   . Hypothyroidism   . GERD (gastroesophageal reflux disease)    Discharge Diagnoses:   Active Problems:   Spinal stenosis of lumbar region  Procedure:  Procedure(s) (LRB): MICRO LUMBAR DECOMPRESSION L5-S1 ON LEFT (Left)   Consults: none  HPI:  see H&P    Laboratory Data: Hospital Outpatient Visit on 10/06/2014  Component Date Value Ref Range Status  . Sodium 10/06/2014 137  135 - 145 mmol/L Final  . Potassium 10/06/2014 4.4  3.5 - 5.1 mmol/L Final  . Chloride 10/06/2014 102  101 - 111 mmol/L Final  . CO2 10/06/2014 27  22 - 32 mmol/L Final  . Glucose, Bld 10/06/2014 101* 65 - 99 mg/dL Final  . BUN 10/06/2014 23* 6 - 20 mg/dL Final  . Creatinine, Ser 10/06/2014 1.07  0.61 - 1.24 mg/dL Final  . Calcium 10/06/2014 9.3  8.9 - 10.3 mg/dL Final  . GFR calc non Af Amer 10/06/2014 >60  >60 mL/min Final  . GFR calc Af Amer 10/06/2014 >60  >60 mL/min Final   Comment: (NOTE) The eGFR has been calculated using the CKD EPI equation. This calculation has not been validated in all clinical situations. eGFR's persistently <60 mL/min signify possible Chronic Kidney Disease.   . Anion gap 10/06/2014 8  5 - 15 Final  . WBC 10/06/2014 8.5  4.0 - 10.5 K/uL Final  . RBC 10/06/2014 4.78  4.22 - 5.81 MIL/uL Final  . Hemoglobin 10/06/2014 14.9  13.0 - 17.0 g/dL Final  . HCT 10/06/2014 44.3  39.0 - 52.0 % Final  . MCV 10/06/2014 92.7  78.0 - 100.0 fL Final  . MCH 10/06/2014 31.2  26.0 - 34.0 pg Final  . MCHC 10/06/2014 33.6  30.0 - 36.0 g/dL Final  . RDW 10/06/2014 13.3  11.5 - 15.5 % Final  . Platelets 10/06/2014 463* 150 - 400 K/uL Final  . MRSA, PCR 10/06/2014 NEGATIVE  NEGATIVE  Final  . Staphylococcus aureus 10/06/2014 POSITIVE* NEGATIVE Final   Comment:        The Xpert SA Assay (FDA approved for NASAL specimens in patients over 24 years of age), is one component of a comprehensive surveillance program.  Test performance has been validated by [facility] for patients greater than or equal to 66 year old. It is not intended to diagnose infection nor to guide or monitor treatment.    No results for input(s): HGB in the last 72 hours. No results for input(s): WBC, RBC, HCT, PLT in the last 72 hours. No results for input(s): NA, K, CL, CO2, BUN, CREATININE, GLUCOSE, CALCIUM in the last 72 hours. No results for input(s): LABPT, INR in the last 72 hours.  X-Rays:Dg Lumbar Spine 2-3 Views  10/06/2014   CLINICAL DATA:  Lumbar herniated nucleus pulposis.  Preop.  EXAM: LUMBAR SPINE - 2-3 VIEW  COMPARISON:  Lumbar spine MRI 10/27/2007 and radiographs 07/07/2006  FINDINGS: There are 5 non rib-bearing lumbar type vertebral bodies. The lowest fully formed intervertebral disc space is labeled L5-S1.  Vertebral body heights are preserved. Vertebral alignment is normal. There is mild disc space narrowing at L4-5 and L5-S1. No lytic or blastic osseous lesion or soft tissue abnormality is  seen.  IMPRESSION: Normal lumbar segmentation.  Mild lower lumbar disc degeneration.   Electronically Signed   By: Logan Bores M.D.   On: 10/06/2014 14:52   Dg Spine Portable 1 View  10/13/2014   CLINICAL DATA:  44 year old male undergoing lumbar laminectomy and decompression with microdiscectomy at L5-S1.  EXAM: PORTABLE SPINE - 1 VIEW  COMPARISON:  10/13/2014 at 12:06.  FINDINGS: Posterior to L5-S1 soft tissue retractors are in place, and there has been interval advancement of the soft tissue probe, the tip of which now on lies immediately posterior to the L5-S1 disc.  IMPRESSION: 1. Intraoperative localization at L5-S1, as above.   Electronically Signed   By: Vinnie Langton M.D.   On:  10/13/2014 12:57   Dg Spine Portable 1 View  10/13/2014   CLINICAL DATA:  Intraoperative imaging, L5-S1 micro diskectomy  EXAM: PORTABLE SPINE - 1 VIEW  COMPARISON:  06/12/2014  FINDINGS: Using numbering from exam obtained earlier today at 11:54 a.m., a radiopaque marker is noted posterior to the L5-S1 interspace  IMPRESSION: Intraoperative localization with marker posterior to the L5-S1 interspace.   Electronically Signed   By: Conchita Paris M.D.   On: 10/13/2014 12:15   Dg Spine Portable 1 View  10/13/2014   CLINICAL DATA:  Intraoperative localization for spine surgery.  EXAM: PORTABLE SPINE - 1 VIEW  COMPARISON:  Preoperative lumbar spine series 922 2016  FINDINGS: There are posterior spinal needles marking the L4-5 and L5-S1 disc space levels.  IMPRESSION: L4-5 and L5-S1 marked intraoperatively.   Electronically Signed   By: Marijo Sanes M.D.   On: 10/13/2014 12:02    EKG:No orders found for this or any previous visit.   Hospital Course: Patient was admitted to [facility] and taken to the OR and underwent the above state procedure without complications.  Patient tolerated the procedure well and was later transferred to the recovery room and then to the orthopaedic floor for postoperative care.  They were given PO and IV analgesics for pain control following their surgery.  They were given 24 hours of postoperative antibiotics.   PT was consulted postop to assist with mobility and transfers.  The patient was allowed to be WBAT with therapy and was taught back precautions. Discharge planning was consulted to help with postop disposition and equipment needs.  Patient had a good night on the evening of surgery and started to get up OOB with therapy on day one. Patient was seen in rounds and was ready to go home on day one.  They were given discharge instructions and dressing directions.  They were instructed on when to follow up in the office with Dr. Tonita Cong.   Diet: Regular  diet Activity:WBAT; Lspine precautions Follow-up:in 10-14 days Disposition - Home Discharged Condition: good   Discharge Instructions    Call MD / Call 911    Complete by:  As directed   If you experience chest pain or shortness of breath, CALL 911 and be transported to the hospital emergency room.  If you develope a fever above 101 F, pus (white drainage) or increased drainage or redness at the wound, or calf pain, call your surgeon's office.     Constipation Prevention    Complete by:  As directed   Drink plenty of fluids.  Prune juice may be helpful.  You may use a stool softener, such as Colace (over the counter) 100 mg twice a day.  Use MiraLax (over the counter) for constipation as needed.  Diet - low sodium heart healthy    Complete by:  As directed      Increase activity slowly as tolerated    Complete by:  As directed             Medication List    STOP taking these medications        HYDROcodone-acetaminophen 10-325 MG tablet  Commonly known as:  NORCO      TAKE these medications        bacitracin ointment  Apply 1 application topically 2 (two) times daily.     diclofenac 75 MG EC tablet  Commonly known as:  VOLTAREN  Take 1 tablet (75 mg total) by mouth daily. Resume 5 days post-op     docusate sodium 100 MG capsule  Commonly known as:  COLACE  Take 1 capsule (100 mg total) by mouth 2 (two) times daily as needed for mild constipation.     lansoprazole 15 MG capsule  Commonly known as:  PREVACID  Take 15 mg by mouth daily.     levothyroxine 75 MCG tablet  Commonly known as:  SYNTHROID, LEVOTHROID  Take 75 mcg by mouth daily before breakfast.     meclizine 25 MG tablet  Commonly known as:  ANTIVERT  Take 1 tablet (25 mg total) by mouth every 6 (six) hours as needed for dizziness.     methocarbamol 500 MG tablet  Commonly known as:  ROBAXIN  Take 1 tablet (500 mg total) by mouth 3 (three) times daily.     oxyCODONE-acetaminophen 5-325 MG tablet   Commonly known as:  PERCOCET  Take 1 tablet by mouth every 4 (four) hours as needed.     PARoxetine 20 MG tablet  Commonly known as:  PAXIL  Take 30 mg by mouth every morning.           Follow-up Information    Follow up with BEANE,JEFFREY C, MD In 2 weeks.   Specialty:  Orthopedic Surgery   Contact information:   [address] [place] [address] [phone]       Signed: Lacie Draft, PA-C Orthopaedic Surgery 10/14/2014, 10:19 AM

## 2014-10-14 NOTE — Progress Notes (Signed)
Physical Therapy Treatment Patient Details Name: Nicholas Mckinney MRN: [id number] DOB: 07-19-1970 Today's Date: 10/14/2014    History of Present Illness MICRO LUMBAR DECOMPRESSION L5-S1 ON LEFT (Left    PT Comments    Patient is very pleased with progress, lack of pain and improved L foot drop, none noted. Ready for DC.  Follow Up Recommendations  No PT follow up     Equipment Recommendations       Recommendations for Other Services       Precautions / Restrictions Precautions Precautions: Back Precaution Comments: stated 3/3 precautions    Mobility  Bed Mobility               General bed mobility comments: pt up in room with family.  Transfers Overall transfer level: Modified independent Equipment used: None Transfers: Sit to/from Stand Sit to Stand: Supervision         General transfer comment: cues for back precautions and hand placement.  Ambulation/Gait Ambulation/Gait assistance: Modified independent (Device/Increase time) Ambulation Distance (Feet): 400 Feet Assistive device: None       General Gait Details: no assist/   Stairs Stairs: Yes Stairs assistance: Modified independent (Device/Increase time) Stair Management: One rail Right;One rail Left;Forwards;Step to pattern Number of Stairs: 4 General stair comments: cues for safety and sequence  Wheelchair Mobility    Modified Rankin (Stroke Patients Only)       Balance                                    Cognition Arousal/Alertness: Awake/alert Behavior During Therapy: WFL for tasks assessed/performed Overall Cognitive Status: Within Functional Limits for tasks assessed                      Exercises      General Comments        Pertinent Vitals/Pain Pain Assessment: No/denies pain Pain Score: 2  Pain Location: back incision Pain Descriptors / Indicators: Sore Pain Intervention(s): Premedicated before session    Home Living Family/patient expects  to be discharged to:: Private residence Living Arrangements: Spouse/significant other Available Help at Discharge: Family Type of Home: House Home Access: Stairs to enter Entrance Stairs-Rails: Right Home Layout: One level Home Equipment: Bedside commode Additional Comments: pt states he can borrow a 3in1. he states he plans to obtain a reacher and LHS    Prior Function Level of Independence: Independent          PT Goals (current goals can now be found in the care plan section) Acute Rehab PT Goals Patient Stated Goal: to continue to get stronger. Progress towards PT goals: Progressing toward goals    Frequency       PT Plan Current plan remains appropriate    Co-evaluation             End of Session   Activity Tolerance: Patient tolerated treatment well Patient left:  (up in room)     Time: 4098-1191 PT Time Calculation (min) (ACUTE ONLY): 13 min  Charges:  $Gait Training: 8-22 mins                    G Codes:      [name] 10/14/2014, 10:03 AM

## 2014-10-14 NOTE — Progress Notes (Signed)
Patient is alert and oriented. VS stable. Patient up in hall walking. No PRN medications given for pain. Minimal complaints of pain but he stated he did not want anything currently for pain. Patient worked with PT and cleared for discharge. Patient discharged to home with family. IV removed and intact.

## 2015-01-15 HISTORY — PX: UPPER GI ENDOSCOPY: SHX6162

## 2015-01-15 HISTORY — PX: COLONOSCOPY: SHX174

## 2015-02-02 ENCOUNTER — Encounter: Payer: Self-pay | Admitting: Family Medicine

## 2015-02-03 ENCOUNTER — Ambulatory Visit (INDEPENDENT_AMBULATORY_CARE_PROVIDER_SITE_OTHER): Payer: Commercial Managed Care - HMO | Admitting: Family Medicine

## 2015-02-03 ENCOUNTER — Encounter: Payer: Self-pay | Admitting: Family Medicine

## 2015-02-03 VITALS — BP 143/87 | HR 78 | Temp 98.4°F | Resp 16 | Ht 70.75 in | Wt 229.5 lb

## 2015-02-03 DIAGNOSIS — F32A Depression, unspecified: Secondary | ICD-10-CM

## 2015-02-03 DIAGNOSIS — E039 Hypothyroidism, unspecified: Secondary | ICD-10-CM | POA: Diagnosis not present

## 2015-02-03 DIAGNOSIS — K2100 Gastro-esophageal reflux disease with esophagitis, without bleeding: Secondary | ICD-10-CM

## 2015-02-03 DIAGNOSIS — F329 Major depressive disorder, single episode, unspecified: Secondary | ICD-10-CM

## 2015-02-03 LAB — TSH: TSH: 1.18 u[IU]/mL (ref 0.35–4.50)

## 2015-02-03 MED ORDER — PANTOPRAZOLE SODIUM 40 MG PO TBEC: 40.0000 mg | DELAYED_RELEASE_TABLET | Freq: Every day | ORAL | Status: DC
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
## 2015-02-03 NOTE — Patient Instructions (Signed)
Take your rx acid pill every morning and take a 150 mg otc generic zantac every night.  After 1 mo you can stop the zantac.

## 2015-02-03 NOTE — Progress Notes (Signed)
Pre visit review using our clinic review tool, if applicable. No additional management support is needed unless otherwise documented below in the visit note. 

## 2015-02-04 ENCOUNTER — Other Ambulatory Visit: Payer: Self-pay | Admitting: Family Medicine

## 2015-02-04 MED ORDER — LEVOTHYROXINE SODIUM 75 MCG PO TABS: 75.0000 ug | ORAL_TABLET | Freq: Every day | ORAL | Status: DC

## 2015-02-07 ENCOUNTER — Encounter: Payer: Self-pay | Admitting: Gastroenterology

## 2015-02-21 ENCOUNTER — Other Ambulatory Visit: Payer: Self-pay | Admitting: *Deleted

## 2015-02-21 MED ORDER — PAROXETINE HCL 20 MG PO TABS: 30.0000 mg | ORAL_TABLET | ORAL | Status: DC

## 2015-02-21 NOTE — Telephone Encounter (Signed)
RF request for Paxil LOV: 02/03/15 Next ov: 08/07/15 Last written: unknown - new pt

## 2015-02-27 ENCOUNTER — Ambulatory Visit: Payer: Self-pay | Admitting: Family Medicine

## 2015-02-28 ENCOUNTER — Encounter: Payer: Self-pay | Admitting: Family Medicine

## 2015-02-28 ENCOUNTER — Telehealth: Payer: Self-pay | Admitting: Family Medicine

## 2015-02-28 ENCOUNTER — Ambulatory Visit (INDEPENDENT_AMBULATORY_CARE_PROVIDER_SITE_OTHER): Payer: Commercial Managed Care - HMO | Admitting: Family Medicine

## 2015-02-28 ENCOUNTER — Ambulatory Visit (HOSPITAL_BASED_OUTPATIENT_CLINIC_OR_DEPARTMENT_OTHER)
Admission: RE | Admit: 2015-02-28 | Discharge: 2015-02-28 | Disposition: A | Discharge status: Home / Self Care | Payer: Commercial Managed Care - HMO | Source: Ambulatory Visit | Attending: Family Medicine | Admitting: Family Medicine

## 2015-02-28 VITALS — BP 146/87 | HR 79 | Temp 99.2°F | Resp 20 | Wt 232.2 lb

## 2015-02-28 DIAGNOSIS — R109 Unspecified abdominal pain: Secondary | ICD-10-CM | POA: Diagnosis not present

## 2015-02-28 DIAGNOSIS — K5900 Constipation, unspecified: Secondary | ICD-10-CM | POA: Insufficient documentation

## 2015-02-28 DIAGNOSIS — R1030 Lower abdominal pain, unspecified: Secondary | ICD-10-CM | POA: Insufficient documentation

## 2015-02-28 DIAGNOSIS — R300 Dysuria: Secondary | ICD-10-CM | POA: Diagnosis not present

## 2015-02-28 LAB — POC URINALSYSI DIPSTICK (AUTOMATED)
Bilirubin, UA: NEGATIVE
Glucose, UA: NEGATIVE
KETONES UA: NEGATIVE
Nitrite, UA: NEGATIVE
SPEC GRAV UA: 1.025
Urobilinogen, UA: 0.2
pH, UA: 6

## 2015-02-28 LAB — BASIC METABOLIC PANEL
BUN: 15 mg/dL (ref 7–25)
CALCIUM: 9.3 mg/dL (ref 8.6–10.3)
CO2: 23 mmol/L (ref 20–31)
CREATININE: 0.92 mg/dL (ref 0.60–1.35)
Chloride: 102 mmol/L (ref 98–110)
Glucose, Bld: 117 mg/dL — ABNORMAL HIGH (ref 65–99)
Potassium: 4.7 mmol/L (ref 3.5–5.3)
SODIUM: 137 mmol/L (ref 135–146)

## 2015-02-28 LAB — CBC WITH DIFFERENTIAL/PLATELET
BASOS PCT: 1 % (ref 0–1)
Basophils Absolute: 0.2 10*3/uL — ABNORMAL HIGH (ref 0.0–0.1)
Eosinophils Absolute: 0.4 10*3/uL (ref 0.0–0.7)
Eosinophils Relative: 2 % (ref 0–5)
HEMATOCRIT: 42.8 % (ref 39.0–52.0)
Hemoglobin: 14.7 g/dL (ref 13.0–17.0)
LYMPHS PCT: 21 % (ref 12–46)
Lymphs Abs: 4.5 10*3/uL — ABNORMAL HIGH (ref 0.7–4.0)
MCH: 31.1 pg (ref 26.0–34.0)
MCHC: 34.3 g/dL (ref 30.0–36.0)
MCV: 90.5 fL (ref 78.0–100.0)
MONO ABS: 2.2 10*3/uL — AB (ref 0.1–1.0)
MONOS PCT: 10 % (ref 3–12)
MPV: 10.8 fL (ref 8.6–12.4)
NEUTROS ABS: 14.3 10*3/uL — AB (ref 1.7–7.7)
Neutrophils Relative %: 66 % (ref 43–77)
Platelets: 456 10*3/uL — ABNORMAL HIGH (ref 150–400)
RBC: 4.73 MIL/uL (ref 4.22–5.81)
RDW: 13.1 % (ref 11.5–15.5)
WBC: 21.6 10*3/uL — ABNORMAL HIGH (ref 4.0–10.5)

## 2015-02-28 MED ORDER — CIPROFLOXACIN HCL 500 MG PO TABS: 500.0000 mg | ORAL_TABLET | Freq: Two times a day (BID) | ORAL | Status: DC

## 2015-02-28 MED ORDER — CEFTRIAXONE SODIUM 1 G IJ SOLR
1.0000 g | INTRAMUSCULAR | Status: DC
  Administered 2015-02-28: 1 g via INTRAMUSCULAR

## 2015-02-28 MED ORDER — KETOROLAC TROMETHAMINE 60 MG/2ML IM SOLN
60.0000 mg | Freq: Once | INTRAMUSCULAR | Status: AC
  Administered 2015-02-28: 60 mg via INTRAMUSCULAR

## 2015-02-28 MED ORDER — NAPROXEN 500 MG PO TABS: 500.0000 mg | ORAL_TABLET | Freq: Two times a day (BID) | ORAL | Status: DC

## 2015-02-28 NOTE — Patient Instructions (Signed)
Kidney Stones Kidney stones (urolithiasis) are deposits that form inside your kidneys. The intense pain is caused by the stone moving through the urinary tract. When the stone moves, the ureter goes into spasm around the stone. The stone is usually passed in the urine.  CAUSES   A disorder that makes certain neck glands produce too much parathyroid hormone (primary hyperparathyroidism).  A buildup of uric acid crystals, similar to gout in your joints.  Narrowing (stricture) of the ureter.  A kidney obstruction present at birth (congenital obstruction).  Previous surgery on the kidney or ureters.  Numerous kidney infections. SYMPTOMS   Feeling sick to your stomach (nauseous).  Throwing up (vomiting).  Blood in the urine (hematuria).  Pain that usually spreads (radiates) to the groin.  Frequency or urgency of urination. DIAGNOSIS   Taking a history and physical exam.  Blood or urine tests.  CT scan.  Occasionally, an examination of the inside of the urinary bladder (cystoscopy) is performed. TREATMENT   Observation.  Increasing your fluid intake.  Extracorporeal shock wave lithotripsy--This is a noninvasive procedure that uses shock waves to break up kidney stones.  Surgery may be needed if you have severe pain or persistent obstruction. There are various surgical procedures. Most of the procedures are performed with the use of small instruments. Only small incisions are needed to accommodate these instruments, so recovery time is minimized. The size, location, and chemical composition are all important variables that will determine the proper choice of action for you. Talk to your health care provider to better understand your situation so that you will minimize the risk of injury to yourself and your kidney.  HOME CARE INSTRUCTIONS   Drink enough water and fluids to keep your urine clear or pale yellow. This will help you to pass the stone or stone fragments.  Strain  all urine through the provided strainer. Keep all particulate matter and stones for your health care provider to see. The stone causing the pain may be as small as a grain of salt. It is very important to use the strainer each and every time you pass your urine. The collection of your stone will allow your health care provider to analyze it and verify that a stone has actually passed. The stone analysis will often identify what you can do to reduce the incidence of recurrences.  Only take over-the-counter or prescription medicines for pain, discomfort, or fever as directed by your health care provider.  Keep all follow-up visits as told by your health care provider. This is important.  Get follow-up X-rays if required. The absence of pain does not always mean that the stone has passed. It may have only stopped moving. If the urine remains completely obstructed, it can cause loss of kidney function or even complete destruction of the kidney. It is your responsibility to make sure X-rays and follow-ups are completed. Ultrasounds of the kidney can show blockages and the status of the kidney. Ultrasounds are not associated with any radiation and can be performed easily in a matter of minutes.  Make changes to your daily diet as told by your health care provider. You may be told to:  Limit the amount of salt that you eat.  Eat 5 or more servings of fruits and vegetables each day.  Limit the amount of meat, poultry, fish, and eggs that you eat.  Collect a 24-hour urine sample as told by your health care provider.You may need to collect another urine sample every 6-12   months. SEEK MEDICAL CARE IF:  You experience pain that is progressive and unresponsive to any pain medicine you have been prescribed. SEEK IMMEDIATE MEDICAL CARE IF:   Pain cannot be controlled with the prescribed medicine.  You have a fever or shaking chills.  The severity or intensity of pain increases over 18 hours and is not  relieved by pain medicine.  You develop a new onset of abdominal pain.  You feel faint or pass out.  You are unable to urinate.   This information is not intended to replace advice given to you by your health care provider. Make sure you discuss any questions you have with your health care provider.   Document Released: 12/31/2004 Document Revised: 09/21/2014 Document Reviewed: 06/03/2012 Elsevier Interactive Patient Education [employer]  Ileus  Ileus is a condition in which the intestines, also called the bowels, stop working and moving correctly. If the intestines stop working, food cannot pass through to get digested. The intestines are hollow organs that digest food after the food leaves the stomach. These organs are long, muscular tubes that connect the stomach to the rectum. When ileus occurs, the muscular contractions that cause food to move through the intestines stop happening as they normally would. Ileus can occur for various reasons. This condition is a serious problem that usually requires hospitalization. It can cause symptoms such as nausea, abdominal pain, and bloating. Ileus can last from a few hours to a few days. If the intestines stop working because of a blockage, that is a different condition that is called a bowel obstruction. CAUSES This condition may be caused by:  Surgery on the abdomen.  An infection or inflammation in the abdomen. This includes inflammation of the lining of the abdomen (peritonitis).  Infection or inflammation in other parts of the body, such as pneumonia or pancreatitis.  Passage of gallstones or kidney stones.  Damage to the nerves or blood vessels that go to the intestines.  A collection of blood within the abdominal cavity.  Imbalance in the salts in the blood (electrolytes).  Injury to the brain or spinal cord.  Medicines. Many medicines, including strong pain medicines, can cause ileus or make it worse. SYMPTOMS Symptoms  of this condition include:  Bloating of the abdomen.  Pain or discomfort in the abdomen.  Poor appetite.  Nausea and vomiting.  Lack of normal bowel sounds, such as "growling" in the stomach. DIAGNOSIS This condition may be diagnosed with:  A physical exam and medical history.  X-rays or a CT scan of the abdomen. You may also have other tests to help find the cause of the condition. TREATMENT Treatment for this condition may include:  Resting the intestines until they start to work again. This is often done by:  Stopping oral intake of food and drink. You will be given fluid through an IV tube to prevent dehydration.  Placing a small tube (nasogastric tube or NG tube) that is passed through your nose and into your stomach. The tube is attached to a suction device and keeps the stomach emptied out. This allows the bowels to rest and also helps to reduce nausea and vomiting.  Correcting any electrolyte imbalance by giving supplements in the IV fluid.  Stopping any medicines that might make ileus worse.  Treating any condition that may have caused ileus. HOME CARE INSTRUCTIONS  Follow instructions from your health care provider about diet and fluid intake. Usually, you will be told to:  Drink plenty of clear  fluids.  Avoid alcohol.  Avoid caffeine.  Eat a bland diet.  Get plenty of rest. Return to your normal activities as told by your health care provider.  Take over-the-counter and prescription medicines only as told by your health care provider.  Keep all follow-up visits as told by your health care provider. This is important. SEEK MEDICAL CARE IF:  You have nausea, vomiting, or abdominal discomfort.  You have a fever. SEEK IMMEDIATE MEDICAL CARE IF:  You have severe abdominal pain or bloating.  You cannot eat or drink without vomiting.   This information is not intended to replace advice given to you by your health care provider. Make sure you discuss any  questions you have with your health care provider.   Document Released: 01/03/2003 Document Revised: 09/21/2014 Document Reviewed: 02/24/2014 Elsevier Interactive Patient Education 2016 [employer].   Diverticulitis Diverticulitis is inflammation or infection of small pouches in your colon that form when you have a condition called diverticulosis. The pouches in your colon are called diverticula. Your colon, or large intestine, is where water is absorbed and stool is formed. Complications of diverticulitis can include:  Bleeding.  Severe infection.  Severe pain.  Perforation of your colon.  Obstruction of your colon. CAUSES  Diverticulitis is caused by bacteria. Diverticulitis happens when stool becomes trapped in diverticula. This allows bacteria to grow in the diverticula, which can lead to inflammation and infection. RISK FACTORS People with diverticulosis are at risk for diverticulitis. Eating a diet that does not include enough fiber from fruits and vegetables may make diverticulitis more likely to develop. SYMPTOMS  Symptoms of diverticulitis may include:  Abdominal pain and tenderness. The pain is normally located on the left side of the abdomen, but may occur in other areas.  Fever and chills.  Bloating.  Cramping.  Nausea.  Vomiting.  Constipation.  Diarrhea.  Blood in your stool. DIAGNOSIS  Your health care provider will ask you about your medical history and do a physical exam. You may need to have tests done because many medical conditions can cause the same symptoms as diverticulitis. Tests may include:  Blood tests.  Urine tests.  Imaging tests of the abdomen, including X-rays and CT scans. When your condition is under control, your health care provider may recommend that you have a colonoscopy. A colonoscopy can show how severe your diverticula are and whether something else is causing your symptoms. TREATMENT  Most cases of diverticulitis are  mild and can be treated at home. Treatment may include:  Taking over-the-counter pain medicines.  Following a clear liquid diet.  Taking antibiotic medicines by mouth for 7-10 days. More severe cases may be treated at a hospital. Treatment may include:  Not eating or drinking.  Taking prescription pain medicine.  Receiving antibiotic medicines through an IV tube.  Receiving fluids and nutrition through an IV tube.  Surgery. HOME CARE INSTRUCTIONS   Follow your health care provider's instructions carefully.  Follow a full liquid diet or other diet as directed by your health care provider. After your symptoms improve, your health care provider may tell you to change your diet. He or she may recommend you eat a high-fiber diet. Fruits and vegetables are good sources of fiber. Fiber makes it easier to pass stool.  Take fiber supplements or probiotics as directed by your health care provider.  Only take medicines as directed by your health care provider.  Keep all your follow-up appointments. SEEK MEDICAL CARE IF:   Your pain  does not improve.  You have a hard time eating food.  Your bowel movements do not return to normal. SEEK IMMEDIATE MEDICAL CARE IF:   Your pain becomes worse.  Your symptoms do not get better.  Your symptoms suddenly get worse.  You have a fever.  You have repeated vomiting.  You have bloody or black, tarry stools. MAKE SURE YOU:   Understand these instructions.  Will watch your condition.  Will get help right away if you are not doing well or get worse.   This information is not intended to replace advice given to you by your health care provider. Make sure you discuss any questions you have with your health care provider.   Document Released: 10/10/2004 Document Revised: 01/05/2013 Document Reviewed: 11/25/2012 Elsevier Interactive Patient Education [employer]  I have given you  toradol (pain) and ceftriaxone (antibiotic)  in  the office I have sent your urine for culture, and will also collect blood work today to check for infection and kidney function. I have ordered abd xray for you to get today.  Follow up 2-3 days, go to ED immediately if symptoms worsen (pain, fever, bloody or painful stool/urine).

## 2015-02-28 NOTE — Telephone Encounter (Signed)
Called pt to give results of xray. Pt xray- with constipation, no obvious stone on xray. No ileus obstruction or perforation.   Follow up 2-3 days
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
## 2015-03-01 ENCOUNTER — Telehealth: Payer: Self-pay | Admitting: Family Medicine

## 2015-03-01 LAB — URINE CULTURE
Colony Count: NO GROWTH
ORGANISM ID, BACTERIA: NO GROWTH

## 2015-03-01 NOTE — Telephone Encounter (Signed)
  Called pt to discuss lab results from acute OV yesterday. Pt has significant elevation in WBC (21.6), with left shift. BMP is unremarkable.  - Still uncertain if this intestinal or urinary tract related. ABD xray with increased stool burden only. Pt has had no more urinary symptoms.  - pt reports improvement in his symptoms today. He has been able to void and has had a normal BM this morning. He has been afebrile and able to tolerate PO.  - Pt has scheduled follow for This Friday. He is aware to go to ED if symptoms would worsen or he becomes febrile on abx.  - Advised stool softener and clear liquid diet, advancing slowly to soft tomorrow if able to tolerate. - pt agreeable with plan.

## 2015-03-03 ENCOUNTER — Ambulatory Visit (INDEPENDENT_AMBULATORY_CARE_PROVIDER_SITE_OTHER): Payer: Commercial Managed Care - HMO | Admitting: Family Medicine

## 2015-03-03 ENCOUNTER — Encounter: Payer: Self-pay | Admitting: Family Medicine

## 2015-03-03 VITALS — BP 138/87 | HR 63 | Temp 98.0°F | Resp 20 | Wt 233.2 lb

## 2015-03-03 DIAGNOSIS — R1030 Lower abdominal pain, unspecified: Secondary | ICD-10-CM

## 2015-03-03 MED ORDER — CIPROFLOXACIN HCL 500 MG PO TABS: 500.0000 mg | ORAL_TABLET | Freq: Two times a day (BID) | ORAL | Status: DC

## 2015-03-03 NOTE — Patient Instructions (Signed)
°  Colitis °Colitis is inflammation of the colon. Colitis may last a short time (acute) or it may last a long time (chronic). °CAUSES °This condition may be caused by: °· Viruses. °· Bacteria. °· Reactions to medicine. °· Certain autoimmune diseases, such as Crohn disease or ulcerative colitis. °SYMPTOMS °Symptoms of this condition include: °· Diarrhea. °· Passing bloody or tarry stool. °· Pain. °· Fever. °· Vomiting. °· Tiredness (fatigue). °· Weight loss. °· Bloating. °· Sudden increase in abdominal pain. °· Having fewer bowel movements than usual. °DIAGNOSIS °This condition is diagnosed with a stool test or a blood test. You may also have other tests, including X-rays, a CT scan, or a colonoscopy. °TREATMENT °Treatment may include: °· Resting the bowel. This involves not eating or drinking for a period of time. °· Fluids that are given through an IV tube. °· Medicine for pain and diarrhea. °· Antibiotic medicines. °· Cortisone medicines. °· Surgery. °HOME CARE INSTRUCTIONS °Eating and Drinking °· Follow instructions from your health care provider about eating or drinking restrictions. °· Drink enough fluid to keep your urine clear or pale yellow. °· Work with a dietitian to determine which foods cause your condition to flare up. °· Avoid foods that cause flare-ups. °· Eat a well-balanced diet. °Medicines °· Take over-the-counter and prescription medicines only as told by your health care provider. °· If you were prescribed an antibiotic medicine, take it as told by your health care provider. Do not stop taking the antibiotic even if you start to feel better. °General Instructions °· Keep all follow-up visits as told by your health care provider. This is important. °SEEK MEDICAL CARE IF: °· Your symptoms do not go away. °· You develop new symptoms. °SEEK IMMEDIATE MEDICAL CARE IF: °· You have a fever that does not go away with treatment. °· You develop chills. °· You have extreme weakness, fainting, or  dehydration. °· You have repeated vomiting. °· You develop severe pain in your abdomen. °· You pass bloody or tarry stool. °  °This information is not intended to replace advice given to you by your health care provider. Make sure you discuss any questions you have with your health care provider. °  °Document Released: 02/08/2004 Document Revised: 09/21/2014 Document Reviewed: 04/25/2014 °Elsevier Interactive Patient Education ©2016 [employer] ° °
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
## 2015-03-28 ENCOUNTER — Encounter: Payer: Self-pay | Admitting: Gastroenterology

## 2015-03-28 ENCOUNTER — Ambulatory Visit (INDEPENDENT_AMBULATORY_CARE_PROVIDER_SITE_OTHER): Payer: Commercial Managed Care - HMO | Admitting: Gastroenterology

## 2015-03-28 VITALS — BP 132/84 | HR 68 | Ht 70.75 in | Wt 229.2 lb

## 2015-03-28 DIAGNOSIS — R1032 Left lower quadrant pain: Secondary | ICD-10-CM | POA: Diagnosis not present

## 2015-03-28 DIAGNOSIS — K219 Gastro-esophageal reflux disease without esophagitis: Secondary | ICD-10-CM
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
## 2015-03-28 NOTE — Patient Instructions (Signed)
GERD recommendations given. Gastroesophageal Reflux Disease, Adult Normally, food travels down the esophagus and stays in the stomach to be digested. However, when a person has gastroesophageal reflux disease (GERD), food and stomach acid move back up into the esophagus. When this happens, the esophagus becomes sore and inflamed. Over time, GERD can create small holes (ulcers) in the lining of the esophagus.  CAUSES This condition is caused by a problem with the muscle between the esophagus and the stomach (lower esophageal sphincter, or LES). Normally, the LES muscle closes after food passes through the esophagus to the stomach. When the LES is weakened or abnormal, it does not close properly, and that allows food and stomach acid to go back up into the esophagus. The LES can be weakened by certain dietary substances, medicines, and medical conditions, including:  Tobacco use.  Pregnancy.  Having a hiatal hernia.  Heavy alcohol use.  Certain foods and beverages, such as coffee, chocolate, onions, and peppermint. RISK FACTORS This condition is more likely to develop in:  People who have an increased body weight.  People who have connective tissue disorders.  People who use NSAID medicines. SYMPTOMS Symptoms of this condition include:  Heartburn.  Difficult or painful swallowing.  The feeling of having a lump in the throat.  Abitter taste in the mouth.  Bad breath.  Having a large amount of saliva.  Having an upset or bloated stomach.  Belching.  Chest pain.  Shortness of breath or wheezing.  Ongoing (chronic) cough or a night-time cough.  Wearing away of tooth enamel.  Weight loss. Different conditions can cause chest pain. Make sure to see your health care provider if you experience chest pain. DIAGNOSIS Your health care provider will take a medical history and perform a physical exam. To determine if you have mild or severe GERD, your health care provider may  also monitor how you respond to treatment. You may also have other tests, including:  An endoscopy toexamine your stomach and esophagus with a small camera.  A test thatmeasures the acidity level in your esophagus.  A test thatmeasures how much pressure is on your esophagus.  A barium swallow or modified barium swallow to show the shape, size, and functioning of your esophagus. TREATMENT The goal of treatment is to help relieve your symptoms and to prevent complications. Treatment for this condition may vary depending on how severe your symptoms are. Your health care provider may recommend:  Changes to your diet.  Medicine.  Surgery. HOME CARE INSTRUCTIONS Diet  Follow a diet as recommended by your health care provider. This may involve avoiding foods and drinks such as:  Coffee and tea (with or without caffeine).  Drinks that containalcohol.  Energy drinks and sports drinks.  Carbonated drinks or sodas.  Chocolate and cocoa.  Peppermint and mint flavorings.  Garlic and onions.  Horseradish.  Spicy and acidic foods, including peppers, chili powder, curry powder, vinegar, hot sauces, and barbecue sauce.  Citrus fruit juices and citrus fruits, such as oranges, lemons, and limes.  Tomato-based foods, such as red sauce, chili, salsa, and pizza with red sauce.  Fried and fatty foods, such as donuts, french fries, potato chips, and high-fat dressings.  High-fat meats, such as hot dogs and fatty cuts of red and white meats, such as rib eye steak, sausage, ham, and bacon.  High-fat dairy items, such as whole milk, butter, and cream cheese.  Eat small, frequent meals instead of large meals.  Avoid drinking large amounts of  liquid with your meals.  Avoid eating meals during the 2-3 hours before bedtime.  Avoid lying down right after you eat.  Do not exercise right after you eat. General Instructions  Pay attention to any changes in your symptoms.  Take  over-the-counter and prescription medicines only as told by your health care provider. Do not take aspirin, ibuprofen, or other NSAIDs unless your health care provider told you to do so.  Do not use any tobacco products, including cigarettes, chewing tobacco, and e-cigarettes. If you need help quitting, ask your health care provider.  Wear loose-fitting clothing. Do not wear anything tight around your waist that causes pressure on your abdomen.  Raise (elevate) the head of your bed 6 inches (15cm).  Try to reduce your stress, such as with yoga or meditation. If you need help reducing stress, ask your health care provider.  If you are overweight, reduce your weight to an amount that is healthy for you. Ask your health care provider for guidance about a safe weight loss goal.  Keep all follow-up visits as told by your health care provider. This is important. SEEK MEDICAL CARE IF:  You have new symptoms.  You have unexplained weight loss.  You have difficulty swallowing, or it hurts to swallow.  You have wheezing or a persistent cough.  Your symptoms do not improve with treatment.  You have a hoarse voice. SEEK IMMEDIATE MEDICAL CARE IF:  You have pain in your arms, neck, jaw, teeth, or back.  You feel sweaty, dizzy, or light-headed.  You have chest pain or shortness of breath.  You vomit and your vomit looks like blood or coffee grounds.  You faint.  Your stool is bloody or black.  You cannot swallow, drink, or eat.   This information is not intended to replace advice given to you by your health care provider. Make sure you discuss any questions you have with your health care provider.   Document Released: 10/10/2004 Document Revised: 09/21/2014 Document Reviewed: 04/27/2014 Elsevier Interactive Patient Education [employer]

## 2015-04-20 ENCOUNTER — Other Ambulatory Visit: Payer: Self-pay | Admitting: *Deleted

## 2015-04-20 MED ORDER — PAROXETINE HCL 20 MG PO TABS: 30.0000 mg | ORAL_TABLET | ORAL | Status: DC

## 2015-04-20 NOTE — Telephone Encounter (Signed)
Pt LMOM on 04/20/15 at 8:08am stating that he needed RF for levothyroxine and needed a new Rx sent in for his paroxetine because the qty sent in last refill was wrong. He stated that he is taking 1.5 tablet daily of the paroxetine and he is running out. [facility] [place] [facility] and she stated that they received Rx for levothyroxine in January and will go ahead and get that ready for pt. New Rx for paroxetine sen in for #45 w/ 11RF. Left detailed message on cell vm, okay per DPR.

## 2015-04-27 ENCOUNTER — Ambulatory Visit (AMBULATORY_SURGERY_CENTER): Payer: Commercial Managed Care - HMO | Admitting: Gastroenterology

## 2015-04-27 ENCOUNTER — Encounter: Payer: Self-pay | Admitting: Gastroenterology

## 2015-04-27 VITALS — BP 123/80 | HR 64 | Temp 98.7°F | Resp 16 | Ht 70.0 in | Wt 229.0 lb

## 2015-04-27 DIAGNOSIS — K219 Gastro-esophageal reflux disease without esophagitis: Secondary | ICD-10-CM | POA: Diagnosis not present

## 2015-04-27 MED ORDER — SODIUM CHLORIDE 0.9 % IV SOLN: 500.0000 mL | INTRAVENOUS | Status: DC

## 2015-04-27 NOTE — Progress Notes (Signed)
To recovery, report to Tyrell, RN, VSS. 

## 2015-04-27 NOTE — Patient Instructions (Signed)
YOU HAD AN ENDOSCOPIC PROCEDURE TODAY AT THE [facility]:   Refer to the procedure report that was given to you for any specific questions about what was found during the examination.  If the procedure report does not answer your questions, please call your gastroenterologist to clarify.  If you requested that your care partner not be given the details of your procedure findings, then the procedure report has been included in a sealed envelope for you to review at your convenience later.  YOU SHOULD EXPECT: Some feelings of bloating in the abdomen. Passage of more gas than usual.  Walking can help get rid of the air that was put into your GI tract during the procedure and reduce the bloating. If you had a lower endoscopy (such as a colonoscopy or flexible sigmoidoscopy) you may notice spotting of blood in your stool or on the toilet paper. If you underwent a bowel prep for your procedure, you may not have a normal bowel movement for a few days.  Please Note:  You might notice some irritation and congestion in your nose or some drainage.  This is from the oxygen used during your procedure.  There is no need for concern and it should clear up in a day or so.  SYMPTOMS TO REPORT IMMEDIATELY:    Following upper endoscopy (EGD)  Vomiting of blood or coffee ground material  New chest pain or pain under the shoulder blades  Painful or persistently difficult swallowing  New shortness of breath  Fever of 100F or higher  Black, tarry-looking stools  For urgent or emergent issues, a gastroenterologist can be reached at any hour by calling [phone].   DIET: Your first meal following the procedure should be a small meal and then it is ok to progress to your normal diet. Heavy or fried foods are harder to digest and may make you feel nauseous or bloated.  Likewise, meals heavy in dairy and vegetables can increase bloating.  Drink plenty of fluids but you should avoid alcoholic beverages  for 24 hours.  ACTIVITY:  You should plan to take it easy for the rest of today and you should NOT DRIVE or use heavy machinery until tomorrow (because of the sedation medicines used during the test).    FOLLOW UP: Our staff will call the number listed on your records the next business day following your procedure to check on you and address any questions or concerns that you may have regarding the information given to you following your procedure. If we do not reach you, we will leave a message.  However, if you are feeling well and you are not experiencing any problems, there is no need to return our call.  We will assume that you have returned to your regular daily activities without incident.  If any biopsies were taken you will be contacted by phone or by letter within the next 1-3 weeks.  Please call us at [phone] if you have not heard about the biopsies in 3 weeks.    SIGNATURES/CONFIDENTIALITY: You and/or your care partner have signed paperwork which will be entered into your electronic medical record.  These signatures attest to the fact that that the information above on your After Visit Summary has been reviewed and is understood.  Full responsibility of the confidentiality of this discharge information lies with you and/or your care-partner. 

## 2015-04-27 NOTE — Op Note (Signed)
[facility] Patient Name: Nicholas Mckinney Procedure Date: 04/27/2015 2:03 PM MRN: [id number] Endoscopist: Sherilyn Cooter L. Myrtie Neither , MD Age: 45 Date of Birth: [birth year] Gender: Male Procedure:                Upper GI endoscopy Indications:              Suspected gastro-esophageal reflux disease Medicines:                Monitored Anesthesia Care Procedure:                Pre-Anesthesia Assessment:                           - Prior to the procedure, a History and Physical                            was performed, and patient medications and                            allergies were reviewed. The patient's tolerance of                            previous anesthesia was also reviewed. The risks                            and benefits of the procedure and the sedation                            options and risks were discussed with the patient.                            All questions were answered, and informed consent                            was obtained. Prior Anticoagulants: The patient has                            taken no previous anticoagulant or antiplatelet                            agents. ASA Grade Assessment: I - A normal, healthy                            patient. After reviewing the risks and benefits,                            the patient was deemed in satisfactory condition to                            undergo the procedure.                           After obtaining informed consent, the endoscope was  passed under direct vision. Throughout the                            procedure, the patient's blood pressure, pulse, and                            oxygen saturations were monitored continuously. The                            Model GIF-HQ190 [id number]) scope was introduced                            through the mouth, and advanced to the second part                            of duodenum. The upper GI endoscopy was    accomplished without difficulty. The patient                            tolerated the procedure well. Scope In: Scope Out: Findings:                 A small hiatal hernia was present.                           The stomach was normal.                           The examined duodenum was normal. Specifically,                            there was no Barrett's esophagus.                           The cardia and gastric fundus were normal on                            retroflexion. Complications:            No immediate complications. Estimated Blood Loss:     Estimated blood loss: none. Impression:               - Small hiatal hernia.                           - Normal stomach.                           - Normal examined duodenum.                           - No specimens collected. Recommendation:           - Patient has a contact number available for                            emergencies. The signs and symptoms of potential  delayed complications were discussed with the                            patient. Return to normal activities tomorrow.                            Written discharge instructions were provided to the                            patient.                           - Resume previous diet.                           - Continue present medications.                           - No repeat upper endoscopy.                           - Diet and lifestyle modifications for GERD as                            discussed during recent office visit. If persistent                            and severe symptoms despite that, return to GI                            clinic to discuss further testing such as                            esophageal pH and motility before consideration of                            surgery. Henry L. Myrtie Neitheranis, MD 04/27/2015 2:21:37 PM This report has been signed electronically.

## 2015-05-01 ENCOUNTER — Telehealth: Payer: Self-pay

## 2015-05-01 NOTE — Telephone Encounter (Signed)
Left message on answering machine. 

## 2015-05-22 ENCOUNTER — Encounter: Payer: Self-pay | Admitting: Family Medicine

## 2015-05-22 ENCOUNTER — Ambulatory Visit (INDEPENDENT_AMBULATORY_CARE_PROVIDER_SITE_OTHER): Payer: Commercial Managed Care - HMO | Admitting: Family Medicine

## 2015-05-22 DIAGNOSIS — R1030 Lower abdominal pain, unspecified: Secondary | ICD-10-CM | POA: Diagnosis not present

## 2015-05-22 LAB — CBC WITH DIFFERENTIAL/PLATELET
BASOS PCT: 0 %
Basophils Absolute: 0 cells/uL (ref 0–200)
Eosinophils Absolute: 204 cells/uL (ref 15–500)
Eosinophils Relative: 1 %
HEMATOCRIT: 45.4 % (ref 38.5–50.0)
Hemoglobin: 15.9 g/dL (ref 13.2–17.1)
LYMPHS PCT: 19 %
Lymphs Abs: 3876 cells/uL (ref 850–3900)
MCH: 31.3 pg (ref 27.0–33.0)
MCHC: 35 g/dL (ref 32.0–36.0)
MCV: 89.4 fL (ref 80.0–100.0)
MONO ABS: 1836 {cells}/uL — AB (ref 200–950)
MPV: 10.9 fL (ref 7.5–12.5)
Monocytes Relative: 9 %
NEUTROS ABS: 14484 {cells}/uL — AB (ref 1500–7800)
Neutrophils Relative %: 71 %
PLATELETS: 483 10*3/uL — AB (ref 140–400)
RBC: 5.08 MIL/uL (ref 4.20–5.80)
RDW: 13.5 % (ref 11.0–15.0)
WBC: 20.4 10*3/uL — AB (ref 3.8–10.8)

## 2015-05-22 MED ORDER — CIPROFLOXACIN HCL 500 MG PO TABS: 500.0000 mg | ORAL_TABLET | Freq: Two times a day (BID) | ORAL | Status: DC

## 2015-05-22 MED ORDER — NAPROXEN 500 MG PO TABS: 500.0000 mg | ORAL_TABLET | Freq: Two times a day (BID) | ORAL | Status: DC

## 2015-05-22 MED ORDER — CEFTRIAXONE SODIUM 1 G IJ SOLR
1.0000 g | Freq: Once | INTRAMUSCULAR | Status: AC
  Administered 2015-05-22: 1 g via INTRAMUSCULAR

## 2015-05-22 NOTE — Patient Instructions (Signed)
CT tomorrow morning.  I will call you with results once available.
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
## 2015-05-23 ENCOUNTER — Ambulatory Visit (HOSPITAL_BASED_OUTPATIENT_CLINIC_OR_DEPARTMENT_OTHER)
Admission: RE | Admit: 2015-05-23 | Discharge: 2015-05-23 | Disposition: A | Discharge status: Home / Self Care | Payer: Commercial Managed Care - HMO | Source: Ambulatory Visit | Attending: Family Medicine | Admitting: Family Medicine

## 2015-05-23 ENCOUNTER — Telehealth: Payer: Self-pay | Admitting: Family Medicine

## 2015-05-23 DIAGNOSIS — R1030 Lower abdominal pain, unspecified: Secondary | ICD-10-CM | POA: Diagnosis present

## 2015-05-23 DIAGNOSIS — K5730 Diverticulosis of large intestine without perforation or abscess without bleeding: Secondary | ICD-10-CM | POA: Diagnosis not present

## 2015-05-23 LAB — BASIC METABOLIC PANEL
BUN: 17 mg/dL (ref 7–25)
CHLORIDE: 100 mmol/L (ref 98–110)
CO2: 20 mmol/L (ref 20–31)
Calcium: 9.1 mg/dL (ref 8.6–10.3)
Creat: 0.8 mg/dL (ref 0.60–1.35)
Glucose, Bld: 112 mg/dL — ABNORMAL HIGH (ref 65–99)
POTASSIUM: 4.3 mmol/L (ref 3.5–5.3)
Sodium: 135 mmol/L (ref 135–146)

## 2015-05-23 LAB — C-REACTIVE PROTEIN: CRP: 7.4 mg/dL — ABNORMAL HIGH (ref <0.60)

## 2015-05-23 MED ORDER — IOPAMIDOL (ISOVUE-300) INJECTION 61%
100.0000 mL | Freq: Once | INTRAVENOUS | Status: AC | PRN
  Administered 2015-05-23: 100 mL via INTRAVENOUS

## 2015-05-23 NOTE — Telephone Encounter (Signed)
Left message with results and instructions to call office to schedule an appt for follow up.

## 2015-05-23 NOTE — Telephone Encounter (Signed)
Please call pt: - His CT results showed acute diverticulitis.  - The abx should work again, however since this is 2 occurences in 3 months he should alter his diet. We can discuss in more detail on his 1 week follow up. Please make sure he scheduled one.  - Bowel wall thickening and pericolonic inflammatory changes involving the sigmoid colon with scattered diverticular disease most consistent with acute diverticulitis without a peridiverticular abscess

## 2015-05-24 ENCOUNTER — Encounter: Payer: Self-pay | Admitting: *Deleted

## 2015-05-26 ENCOUNTER — Encounter: Payer: Self-pay | Admitting: Family Medicine

## 2015-05-26 ENCOUNTER — Ambulatory Visit (INDEPENDENT_AMBULATORY_CARE_PROVIDER_SITE_OTHER): Payer: Commercial Managed Care - HMO | Admitting: Family Medicine

## 2015-05-26 VITALS — BP 137/87 | HR 70 | Temp 98.6°F | Resp 20 | Wt 226.0 lb

## 2015-05-26 DIAGNOSIS — R1030 Lower abdominal pain, unspecified: Secondary | ICD-10-CM

## 2015-05-26 DIAGNOSIS — K5732 Diverticulitis of large intestine without perforation or abscess without bleeding: Secondary | ICD-10-CM

## 2015-05-26 NOTE — Patient Instructions (Signed)
Diverticulitis Diverticulitis is inflammation or infection of small pouches in your colon that form when you have a condition called diverticulosis. The pouches in your colon are called diverticula. Your colon, or large intestine, is where water is absorbed and stool is formed. Complications of diverticulitis can include:  Bleeding.  Severe infection.  Severe pain.  Perforation of your colon.  Obstruction of your colon. CAUSES  Diverticulitis is caused by bacteria. Diverticulitis happens when stool becomes trapped in diverticula. This allows bacteria to grow in the diverticula, which can lead to inflammation and infection. RISK FACTORS People with diverticulosis are at risk for diverticulitis. Eating a diet that does not include enough fiber from fruits and vegetables may make diverticulitis more likely to develop. SYMPTOMS  Symptoms of diverticulitis may include:  Abdominal pain and tenderness. The pain is normally located on the left side of the abdomen, but may occur in other areas.  Fever and chills.  Bloating.  Cramping.  Nausea.  Vomiting.  Constipation.  Diarrhea.  Blood in your stool. DIAGNOSIS  Your health care provider will ask you about your medical history and do a physical exam. You may need to have tests done because many medical conditions can cause the same symptoms as diverticulitis. Tests may include:  Blood tests.  Urine tests.  Imaging tests of the abdomen, including X-rays and CT scans. When your condition is under control, your health care provider may recommend that you have a colonoscopy. A colonoscopy can show how severe your diverticula are and whether something else is causing your symptoms. TREATMENT  Most cases of diverticulitis are mild and can be treated at home. Treatment may include:  Taking over-the-counter pain medicines.  Following a clear liquid diet.  Taking antibiotic medicines by mouth for 7-10 days. More severe cases may  be treated at a hospital. Treatment may include:  Not eating or drinking.  Taking prescription pain medicine.  Receiving antibiotic medicines through an IV tube.  Receiving fluids and nutrition through an IV tube.  Surgery. HOME CARE INSTRUCTIONS   Follow your health care provider's instructions carefully.  Follow a full liquid diet or other diet as directed by your health care provider. After your symptoms improve, your health care provider may tell you to change your diet. He or she may recommend you eat a high-fiber diet. Fruits and vegetables are good sources of fiber. Fiber makes it easier to pass stool.  Take fiber supplements or probiotics as directed by your health care provider.  Only take medicines as directed by your health care provider.  Keep all your follow-up appointments. SEEK MEDICAL CARE IF:   Your pain does not improve.  You have a hard time eating food.  Your bowel movements do not return to normal. SEEK IMMEDIATE MEDICAL CARE IF:   Your pain becomes worse.  Your symptoms do not get better.  Your symptoms suddenly get worse.  You have a fever.  You have repeated vomiting.  You have bloody or black, tarry stools. MAKE SURE YOU:   Understand these instructions.  Will watch your condition.  Will get help right away if you are not doing well or get worse.   This information is not intended to replace advice given to you by your health care provider. Make sure you discuss any questions you have with your health care provider.   Document Released: 10/10/2004 Document Revised: 01/05/2013 Document Reviewed: 11/25/2012 Elsevier Interactive Patient Education 2016 [employer]    Diverticulosis Diverticulosis is the condition that   develops when small pouches (diverticula) form in the wall of your colon. Your colon, or large intestine, is where water is absorbed and stool is formed. The pouches form when the inside layer of your colon pushes through  weak spots in the outer layers of your colon. CAUSES  No one knows exactly what causes diverticulosis. RISK FACTORS  Being older than 50. Your risk for this condition increases with age. Diverticulosis is rare in people younger than 40 years. By age 80, almost everyone has it.  Eating a low-fiber diet.  Being frequently constipated.  Being overweight.  Not getting enough exercise.  Smoking.  Taking over-the-counter pain medicines, like aspirin and ibuprofen. SYMPTOMS  Most people with diverticulosis do not have symptoms. DIAGNOSIS  Because diverticulosis often has no symptoms, health care providers often discover the condition during an exam for other colon problems. In many cases, a health care provider will diagnose diverticulosis while using a flexible scope to examine the colon (colonoscopy). TREATMENT  If you have never developed an infection related to diverticulosis, you may not need treatment. If you have had an infection before, treatment may include:  Eating more fruits, vegetables, and grains.  Taking a fiber supplement.  Taking a live bacteria supplement (probiotic).  Taking medicine to relax your colon. HOME CARE INSTRUCTIONS   Drink at least 6-8 glasses of water each day to prevent constipation.  Try not to strain when you have a bowel movement.  Keep all follow-up appointments. If you have had an infection before:  Increase the fiber in your diet as directed by your health care provider or dietitian.  Take a dietary fiber supplement if your health care provider approves.  Only take medicines as directed by your health care provider. SEEK MEDICAL CARE IF:   You have abdominal pain.  You have bloating.  You have cramps.  You have not gone to the bathroom in 3 days. SEEK IMMEDIATE MEDICAL CARE IF:   Your pain gets worse.  Yourbloating becomes very bad.  You have a fever or chills, and your symptoms suddenly get worse.  You begin  vomiting.  You have bowel movements that are bloody or black. MAKE SURE YOU:  Understand these instructions.  Will watch your condition.  Will get help right away if you are not doing well or get worse.   This information is not intended to replace advice given to you by your health care provider. Make sure you discuss any questions you have with your health care provider.   Document Released: 09/28/2003 Document Revised: 01/05/2013 Document Reviewed: 11/25/2012 Elsevier Interactive Patient Education 2016 [employer].  
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
## 2015-05-29 DIAGNOSIS — K5732 Diverticulitis of large intestine without perforation or abscess without bleeding: Secondary | ICD-10-CM | POA: Insufficient documentation

## 2015-06-08 ENCOUNTER — Emergency Department (HOSPITAL_COMMUNITY): Payer: Commercial Managed Care - HMO

## 2015-06-08 ENCOUNTER — Encounter: Payer: Self-pay | Admitting: Family Medicine

## 2015-06-08 ENCOUNTER — Encounter (HOSPITAL_COMMUNITY): Payer: Self-pay | Admitting: *Deleted

## 2015-06-08 ENCOUNTER — Emergency Department (HOSPITAL_COMMUNITY)
Admission: EM | Admit: 2015-06-08 | Discharge: 2015-06-08 | Disposition: A | Discharge status: Home / Self Care | Payer: Commercial Managed Care - HMO | Attending: Emergency Medicine | Admitting: Emergency Medicine

## 2015-06-08 DIAGNOSIS — R42 Dizziness and giddiness: Secondary | ICD-10-CM | POA: Insufficient documentation

## 2015-06-08 DIAGNOSIS — R4182 Altered mental status, unspecified: Secondary | ICD-10-CM | POA: Diagnosis present

## 2015-06-08 DIAGNOSIS — Z7982 Long term (current) use of aspirin: Secondary | ICD-10-CM | POA: Diagnosis not present

## 2015-06-08 DIAGNOSIS — F329 Major depressive disorder, single episode, unspecified: Secondary | ICD-10-CM | POA: Insufficient documentation

## 2015-06-08 DIAGNOSIS — Z87891 Personal history of nicotine dependence: Secondary | ICD-10-CM | POA: Insufficient documentation

## 2015-06-08 DIAGNOSIS — Z792 Long term (current) use of antibiotics: Secondary | ICD-10-CM | POA: Insufficient documentation

## 2015-06-08 LAB — LIPASE, BLOOD: Lipase: 26 U/L (ref 11–51)

## 2015-06-08 LAB — COMPREHENSIVE METABOLIC PANEL
ALBUMIN: 4.4 g/dL (ref 3.5–5.0)
ALK PHOS: 71 U/L (ref 38–126)
ALT: 23 U/L (ref 17–63)
AST: 23 U/L (ref 15–41)
Anion gap: 9 (ref 5–15)
BUN: 20 mg/dL (ref 6–20)
CALCIUM: 9.4 mg/dL (ref 8.9–10.3)
CO2: 23 mmol/L (ref 22–32)
CREATININE: 0.84 mg/dL (ref 0.61–1.24)
Chloride: 103 mmol/L (ref 101–111)
GFR calc Af Amer: >60 mL/min (ref >60)
GFR calc non Af Amer: >60 mL/min (ref >60)
GLUCOSE: 113 mg/dL — AB (ref 65–99)
Potassium: 3.7 mmol/L (ref 3.5–5.1)
SODIUM: 135 mmol/L (ref 135–145)
Total Bilirubin: 0.8 mg/dL (ref 0.3–1.2)
Total Protein: 7.8 g/dL (ref 6.5–8.1)

## 2015-06-08 LAB — CBC WITH DIFFERENTIAL/PLATELET
BASOS ABS: 0.2 10*3/uL — AB (ref 0.0–0.1)
Basophils Relative: 1 %
Eosinophils Absolute: 0.9 10*3/uL — ABNORMAL HIGH (ref 0.0–0.7)
Eosinophils Relative: 6 %
HEMATOCRIT: 42.8 % (ref 39.0–52.0)
HEMOGLOBIN: 14.8 g/dL (ref 13.0–17.0)
LYMPHS PCT: 30 %
Lymphs Abs: 4.2 10*3/uL — ABNORMAL HIGH (ref 0.7–4.0)
MCH: 30.7 pg (ref 26.0–34.0)
MCHC: 34.6 g/dL (ref 30.0–36.0)
MCV: 88.8 fL (ref 78.0–100.0)
Monocytes Absolute: 1.3 10*3/uL — ABNORMAL HIGH (ref 0.1–1.0)
Monocytes Relative: 9 %
NEUTROS ABS: 7.3 10*3/uL (ref 1.7–7.7)
Neutrophils Relative %: 54 %
Platelets: 448 10*3/uL — ABNORMAL HIGH (ref 150–400)
RBC: 4.82 MIL/uL (ref 4.22–5.81)
RDW: 12.8 % (ref 11.5–15.5)
WBC: 13.9 10*3/uL — AB (ref 4.0–10.5)

## 2015-06-08 LAB — I-STAT TROPONIN, ED: Troponin i, poc: 0 ng/mL (ref 0.00–0.08)

## 2015-06-08 MED ORDER — MECLIZINE HCL 25 MG PO TABS: 50.0000 mg | ORAL_TABLET | Freq: Three times a day (TID) | ORAL | Status: DC | PRN

## 2015-06-08 MED ORDER — LORAZEPAM 1 MG PO TABS
1.0000 mg | ORAL_TABLET | Freq: Once | ORAL | Status: AC
  Administered 2015-06-08: 1 mg via ORAL
  Filled 2015-06-08: qty 1

## 2015-06-08 MED ORDER — MECLIZINE HCL 25 MG PO TABS
25.0000 mg | ORAL_TABLET | Freq: Once | ORAL | Status: AC
  Administered 2015-06-08: 25 mg via ORAL
  Filled 2015-06-08: qty 1

## 2015-06-08 MED ORDER — ONDANSETRON 4 MG PO TBDP
4.0000 mg | ORAL_TABLET | Freq: Once | ORAL | Status: AC
  Administered 2015-06-08: 4 mg via ORAL
  Filled 2015-06-08: qty 1

## 2015-06-08 NOTE — ED Notes (Signed)
EKG given to EDP,Nanavati,MD., for review. 
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
## 2015-06-08 NOTE — Discharge Instructions (Signed)
Benign Positional Vertigo Vertigo is the feeling that you or your surroundings are moving when they are not. Benign positional vertigo is the most common form of vertigo. The cause of this condition is not serious (is benign). This condition is triggered by certain movements and positions (is positional). This condition can be dangerous if it occurs while you are doing something that could endanger you or others, such as driving.  CAUSES In many cases, the cause of this condition is not known. It may be caused by a disturbance in an area of the inner ear that helps your brain to sense movement and balance. This disturbance can be caused by a viral infection (labyrinthitis), head injury, or repetitive motion. RISK FACTORS This condition is more likely to develop in:  Women.  People who are 50 years of age or older. SYMPTOMS Symptoms of this condition usually happen when you move your head or your eyes in different directions. Symptoms may start suddenly, and they usually last for less than a minute. Symptoms may include:  Loss of balance and falling.  Feeling like you are spinning or moving.  Feeling like your surroundings are spinning or moving.  Nausea and vomiting.  Blurred vision.  Dizziness.  Involuntary eye movement (nystagmus). Symptoms can be mild and cause only slight annoyance, or they can be severe and interfere with daily life. Episodes of benign positional vertigo may return (recur) over time, and they may be triggered by certain movements. Symptoms may improve over time. DIAGNOSIS This condition is usually diagnosed by medical history and a physical exam of the head, neck, and ears. You may be referred to a health care provider who specializes in ear, nose, and throat (ENT) problems (otolaryngologist) or a provider who specializes in disorders of the nervous system (neurologist). You may have additional testing, including:  MRI.  A CT scan.  Eye movement tests. Your  health care provider may ask you to change positions quickly while he or she watches you for symptoms of benign positional vertigo, such as nystagmus. Eye movement may be tested with an electronystagmogram (ENG), caloric stimulation, the Dix-Hallpike test, or the roll test.  An electroencephalogram (EEG). This records electrical activity in your brain.  Hearing tests. TREATMENT Usually, your health care provider will treat this by moving your head in specific positions to adjust your inner ear back to normal. Surgery may be needed in severe cases, but this is rare. In some cases, benign positional vertigo may resolve on its own in 2-4 weeks. HOME CARE INSTRUCTIONS Safety  Move slowly.Avoid sudden body or head movements.  Avoid driving.  Avoid operating heavy machinery.  Avoid doing any tasks that would be dangerous to you or others if a vertigo episode would occur.  If you have trouble walking or keeping your balance, try using a cane for stability. If you feel dizzy or unstable, sit down right away.  Return to your normal activities as told by your health care provider. Ask your health care provider what activities are safe for you. General Instructions  Take over-the-counter and prescription medicines only as told by your health care provider.  Avoid certain positions or movements as told by your health care provider.  Drink enough fluid to keep your urine clear or pale yellow.  Keep all follow-up visits as told by your health care provider. This is important. SEEK MEDICAL CARE IF:  You have a fever.  Your condition gets worse or you develop new symptoms.  Your family or friends   notice any behavioral changes.  Your nausea or vomiting gets worse.  You have numbness or a "pins and needles" sensation. SEEK IMMEDIATE MEDICAL CARE IF:  You have difficulty speaking or moving.  You are always dizzy.  You faint.  You develop severe headaches.  You have weakness in your  legs or arms.  You have changes in your hearing or vision.  You develop a stiff neck.  You develop sensitivity to light.   This information is not intended to replace advice given to you by your health care provider. Make sure you discuss any questions you have with your health care provider.   Document Released: 10/08/2005 Document Revised: 09/21/2014 Document Reviewed: 04/25/2014 Elsevier Interactive Patient Education 2016 [employer]  

## 2015-06-08 NOTE — ED Notes (Addendum)
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

## 2015-06-09 ENCOUNTER — Ambulatory Visit (INDEPENDENT_AMBULATORY_CARE_PROVIDER_SITE_OTHER): Payer: Commercial Managed Care - HMO | Admitting: Family Medicine

## 2015-06-09 ENCOUNTER — Encounter: Payer: Self-pay | Admitting: Family Medicine

## 2015-06-09 VITALS — BP 129/84 | HR 81 | Temp 98.3°F | Resp 16 | Ht 70.75 in | Wt 221.8 lb

## 2015-06-09 DIAGNOSIS — H8110 Benign paroxysmal vertigo, unspecified ear: Secondary | ICD-10-CM

## 2015-06-09 NOTE — Progress Notes (Signed)
Pre visit review using our clinic review tool, if applicable. No additional management support is needed unless otherwise documented below in the visit note. 
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
## 2015-07-11 ENCOUNTER — Encounter: Payer: Self-pay | Admitting: Gastroenterology

## 2015-07-11 ENCOUNTER — Ambulatory Visit (INDEPENDENT_AMBULATORY_CARE_PROVIDER_SITE_OTHER): Payer: Commercial Managed Care - HMO | Admitting: Gastroenterology

## 2015-07-11 VITALS — BP 122/80 | HR 72 | Ht 70.75 in | Wt 224.0 lb

## 2015-07-11 DIAGNOSIS — K5732 Diverticulitis of large intestine without perforation or abscess without bleeding: Secondary | ICD-10-CM | POA: Diagnosis not present

## 2015-07-11 NOTE — Patient Instructions (Signed)
If you are age 46 or older, your body mass index should be between 23-30. Your Body mass index is 31.46 kg/(m^2). If this is out of the aforementioned range listed, please consider follow up with your Primary Care Provider.  If you are age 46 or younger, your body mass index should be between 19-25. Your Body mass index is 31.46 kg/(m^2). If this is out of the aformentioned range listed, please consider follow up with your Primary Care Provider.   Thank you for choosing [facility]  Dr Amada JupiterHenry Danis III

## 2015-07-11 NOTE — Progress Notes (Signed)
[facility] Progress Note  Chief Complaint: Diverticulitis  Subjective History:  This is a 45 year old man known to me from her recent evaluation for GERD. Just prior to that visit in March, he had had 1 episode of what sounded like him, located diverticulitis. Up with a few days of ciprofloxacin. 5 or 6 weeks ago he had another episode of the same symptoms with acute onset left lower quadrant pain and tenderness and feelings of constipation. There was no rectal bleeding, nor has there been any since. He saw his PCP, CT scan showed sigmoid diverticulitis and his WBC was 20,000. He responded well to ciprofloxacin, with resolution of symptoms in a few days. He feels well since then with no abdominal pain, altered bowel habits, rectal bleeding or weight loss He reports to be bleeding 2 bags of popcorn a day during his work at an [employer]. He has since stopped doing that given this recent episode. His reflux symptoms have improved considerably after cutting down his intake of sugar soda and smokeless tobacco ROS: Cardiovascular:  no chest pain Respiratory: no dyspnea  The patient's Past Medical, Family and Social History were reviewed and are on file in the EMR.  Objective:  Med list reviewed  Vital signs in last 24 hrs: Filed Vitals:   07/11/15 0826  BP: 122/80  Pulse: 72    Physical Exam   HEENT: sclera anicteric, oral mucosa moist without lesions  Neck: supple, no thyromegaly, JVD or lymphadenopathy  Cardiac: RRR without murmurs, S1S2 heard, no peripheral edema  Pulm: clear to auscultation bilaterally, normal RR and effort noted  Abdomen: soft, No tenderness, with active bowel sounds. No guarding or palpable hepatosplenomegaly.  Skin; warm and dry, no jaundice or rash  Recent Labs:  CBC    Component Value Date/Time   WBC 13.9* 06/08/2015 0659   RBC 4.82 06/08/2015 0659   HGB 14.8 06/08/2015 0659   HCT 42.8 06/08/2015 0659   PLT 448* 06/08/2015 0659   MCV 88.8  06/08/2015 0659   MCH 30.7 06/08/2015 0659   MCHC 34.6 06/08/2015 0659   RDW 12.8 06/08/2015 0659   LYMPHSABS 4.2* 06/08/2015 0659   MONOABS 1.3* 06/08/2015 0659   EOSABS 0.9* 06/08/2015 0659   BASOSABS 0.2* 06/08/2015 0659      Radiologic studies:  CT scan report as noted above.  @ASSESSMENTPLANBEGIN @ Assessment: Encounter Diagnosis  Name Primary?  . Diverticulitis of colon Yes   He has had 2 episodes of classic sigmoid diverticulitis that was diagnosed quickly and responded quickly to antibiotics. There are no intervening chronic lower digestive symptoms and no rectal bleeding, therefore I do not think a colonoscopy is necessary at this point.  While there is no clear evidence that popcorn, seeds or nuts trigger episodes of diverticulitis, it may have been the trigger for him. He feels most comfortable avoiding them altogether.  Plan: No further treatment or testing at this time. If he has another episode despite his dietary changes, then we probably would need to pursue a colonoscopy in anticipation of a possible surgical referral.   Charlie PitterHenry L Danis III

## 2015-07-31 ENCOUNTER — Encounter: Payer: Self-pay | Admitting: Family Medicine

## 2015-08-04 ENCOUNTER — Other Ambulatory Visit: Payer: Self-pay | Admitting: *Deleted

## 2015-08-04 ENCOUNTER — Encounter: Payer: Self-pay | Admitting: Family Medicine

## 2015-08-04 MED ORDER — MECLIZINE HCL 25 MG PO TABS: 50.0000 mg | ORAL_TABLET | Freq: Three times a day (TID) | ORAL | Status: DC | PRN

## 2015-08-04 NOTE — Telephone Encounter (Signed)
Ok to fill but please advise pt that if sxs don't improve w/ medication he needs to be evaluated

## 2015-08-04 NOTE — Telephone Encounter (Signed)
Could not reach pt by phone, but detailed message left to inform pt of Dr. Rennis Goldenabori's recommendations about being evaluated.

## 2015-08-04 NOTE — Telephone Encounter (Signed)
Pts wife LMOM on 08/04/15 at 12:16pm requesting [facility] [place]. She stated that pt is having an episode of vertigo, he is unable to lift his head up with out getting sick. Please advise. Thanks.   RF request for meclizine LOV: 06/09/15 Next ov: 08/07/15 Last written: 06/08/15 330 w/ 0RF

## 2015-08-07 ENCOUNTER — Emergency Department (HOSPITAL_COMMUNITY)
Admission: EM | Admit: 2015-08-07 | Discharge: 2015-08-07 | Discharge status: Against Medical Advice | Payer: Commercial Managed Care - HMO

## 2015-08-07 ENCOUNTER — Encounter: Payer: Self-pay | Admitting: Family Medicine

## 2015-08-07 NOTE — ED Triage Notes (Signed)
Pt decided to go home and take medication he stated if he feel worse he would come back.

## 2015-08-11 ENCOUNTER — Encounter: Payer: Self-pay | Admitting: Family Medicine

## 2015-08-14 ENCOUNTER — Encounter: Payer: Self-pay | Admitting: Family Medicine

## 2015-08-14 ENCOUNTER — Ambulatory Visit (INDEPENDENT_AMBULATORY_CARE_PROVIDER_SITE_OTHER): Payer: Commercial Managed Care - HMO | Admitting: Family Medicine

## 2015-08-14 VITALS — BP 123/88 | HR 61 | Temp 99.0°F | Resp 16 | Ht 70.25 in | Wt 224.5 lb

## 2015-08-14 DIAGNOSIS — H6091 Unspecified otitis externa, right ear: Secondary | ICD-10-CM | POA: Diagnosis not present

## 2015-08-14 DIAGNOSIS — I889 Nonspecific lymphadenitis, unspecified: Secondary | ICD-10-CM

## 2015-08-14 MED ORDER — AZITHROMYCIN 250 MG PO TABS: ORAL_TABLET | ORAL | 0 refills | Status: DC
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
## 2015-08-14 NOTE — Progress Notes (Signed)
Pre visit review using our clinic review tool, if applicable. No additional management support is needed unless otherwise documented below in the visit note. 

## 2015-08-28 ENCOUNTER — Encounter: Payer: Self-pay | Admitting: Family Medicine

## 2015-08-28 ENCOUNTER — Ambulatory Visit (INDEPENDENT_AMBULATORY_CARE_PROVIDER_SITE_OTHER): Payer: Commercial Managed Care - HMO | Admitting: Family Medicine

## 2015-08-28 VITALS — BP 140/90 | HR 72 | Temp 98.6°F | Resp 16 | Ht 71.0 in | Wt 220.5 lb

## 2015-08-28 DIAGNOSIS — Z0000 Encounter for general adult medical examination without abnormal findings: Secondary | ICD-10-CM

## 2015-08-28 DIAGNOSIS — Z0001 Encounter for general adult medical examination with abnormal findings: Secondary | ICD-10-CM | POA: Diagnosis not present

## 2015-08-28 DIAGNOSIS — Z23 Encounter for immunization: Secondary | ICD-10-CM | POA: Diagnosis not present

## 2015-08-28 DIAGNOSIS — R030 Elevated blood-pressure reading, without diagnosis of hypertension: Secondary | ICD-10-CM

## 2015-08-28 DIAGNOSIS — Z9081 Acquired absence of spleen: Secondary | ICD-10-CM | POA: Insufficient documentation

## 2015-08-28 LAB — LIPID PANEL
CHOLESTEROL: 191 mg/dL (ref 0–200)
HDL: 54.2 mg/dL (ref >39.00)
LDL CALC: 102 mg/dL — AB (ref 0–99)
NonHDL: 136.41
TRIGLYCERIDES: 170 mg/dL — AB (ref 0.0–149.0)
Total CHOL/HDL Ratio: 4
VLDL: 34 mg/dL (ref 0.0–40.0)

## 2015-08-28 LAB — COMPREHENSIVE METABOLIC PANEL
ALBUMIN: 4.3 g/dL (ref 3.5–5.2)
ALK PHOS: 66 U/L (ref 39–117)
ALT: 19 U/L (ref 0–53)
AST: 16 U/L (ref 0–37)
BUN: 21 mg/dL (ref 6–23)
CALCIUM: 9.6 mg/dL (ref 8.4–10.5)
CHLORIDE: 104 meq/L (ref 96–112)
CO2: 26 mEq/L (ref 19–32)
Creatinine, Ser: 1.06 mg/dL (ref 0.40–1.50)
GFR: 79.93 mL/min (ref >60.00)
Glucose, Bld: 95 mg/dL (ref 70–99)
POTASSIUM: 4.9 meq/L (ref 3.5–5.1)
SODIUM: 137 meq/L (ref 135–145)
TOTAL PROTEIN: 7.3 g/dL (ref 6.0–8.3)
Total Bilirubin: 0.4 mg/dL (ref 0.2–1.2)

## 2015-08-28 LAB — CBC WITH DIFFERENTIAL/PLATELET
BASOS PCT: 1 % (ref 0.0–3.0)
Basophils Absolute: 0.1 10*3/uL (ref 0.0–0.1)
Eosinophils Absolute: 0.6 10*3/uL (ref 0.0–0.7)
Eosinophils Relative: 5.3 % — ABNORMAL HIGH (ref 0.0–5.0)
HEMATOCRIT: 44.8 % (ref 39.0–52.0)
HEMOGLOBIN: 15 g/dL (ref 13.0–17.0)
LYMPHS PCT: 31.1 % (ref 12.0–46.0)
Lymphs Abs: 3.5 10*3/uL (ref 0.7–4.0)
MCHC: 33.5 g/dL (ref 30.0–36.0)
MCV: 90.7 fl (ref 78.0–100.0)
Monocytes Absolute: 1 10*3/uL (ref 0.1–1.0)
Monocytes Relative: 8.8 % (ref 3.0–12.0)
NEUTROS ABS: 6 10*3/uL (ref 1.4–7.7)
Neutrophils Relative %: 53.8 % (ref 43.0–77.0)
PLATELETS: 382 10*3/uL (ref 150.0–400.0)
RBC: 4.93 Mil/uL (ref 4.22–5.81)
RDW: 13.9 % (ref 11.5–15.5)
WBC: 11.2 10*3/uL — AB (ref 4.0–10.5)

## 2015-08-28 LAB — TSH: TSH: 1.06 u[IU]/mL (ref 0.35–4.50)

## 2015-08-28 NOTE — Patient Instructions (Signed)
Buy a blood pressure cuff at your pharmacy (upper arm cuff). Check blood pressure 2-3 times per week and write these numbers down. Normal blood pressure is <140/90.

## 2015-08-28 NOTE — Progress Notes (Signed)
Pre visit review using our clinic review tool, if applicable. No additional management support is needed unless otherwise documented below in the visit note. 
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
## 2015-10-25 ENCOUNTER — Encounter: Payer: Self-pay | Admitting: Family Medicine

## 2015-10-25 ENCOUNTER — Ambulatory Visit (INDEPENDENT_AMBULATORY_CARE_PROVIDER_SITE_OTHER): Payer: Commercial Managed Care - HMO | Admitting: Family Medicine

## 2015-10-25 VITALS — BP 134/94 | HR 67 | Temp 98.1°F | Resp 18 | Wt 221.0 lb

## 2015-10-25 DIAGNOSIS — J069 Acute upper respiratory infection, unspecified: Secondary | ICD-10-CM

## 2015-10-25 DIAGNOSIS — B9789 Other viral agents as the cause of diseases classified elsewhere: Secondary | ICD-10-CM | POA: Diagnosis not present

## 2015-10-25 DIAGNOSIS — J029 Acute pharyngitis, unspecified: Secondary | ICD-10-CM | POA: Diagnosis not present

## 2015-10-25 LAB — POCT RAPID STREP A (OFFICE): RAPID STREP A SCREEN: NEGATIVE

## 2015-10-25 MED ORDER — IPRATROPIUM BROMIDE 0.03 % NA SOLN: 2.0000 | Freq: Two times a day (BID) | NASAL | 1 refills | Status: DC

## 2015-10-25 NOTE — Patient Instructions (Signed)
Get otc generic robitussin DM OR Mucinex DM and use as directed on the packaging for cough and congestion. Use otc generic saline nasal spray 2-3 times per day to irrigate/moisturize your nasal passages.   
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
## 2015-11-03 ENCOUNTER — Ambulatory Visit (INDEPENDENT_AMBULATORY_CARE_PROVIDER_SITE_OTHER): Payer: Commercial Managed Care - HMO | Admitting: Family Medicine

## 2015-11-03 ENCOUNTER — Encounter: Payer: Self-pay | Admitting: Family Medicine

## 2015-11-03 VITALS — BP 158/88 | HR 75 | Temp 98.6°F | Resp 18 | Wt 224.4 lb

## 2015-11-03 DIAGNOSIS — G4750 Parasomnia, unspecified: Secondary | ICD-10-CM | POA: Diagnosis not present
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
## 2015-11-03 NOTE — Progress Notes (Signed)
Pre visit review using our clinic review tool, if applicable. No additional management support is needed unless otherwise documented below in the visit note. 

## 2015-11-15 DIAGNOSIS — G4733 Obstructive sleep apnea (adult) (pediatric): Secondary | ICD-10-CM

## 2015-11-15 HISTORY — DX: Obstructive sleep apnea (adult) (pediatric): G47.33

## 2015-11-27 ENCOUNTER — Encounter: Payer: Self-pay | Admitting: Neurology

## 2015-11-27 ENCOUNTER — Ambulatory Visit (INDEPENDENT_AMBULATORY_CARE_PROVIDER_SITE_OTHER): Payer: Commercial Managed Care - HMO | Admitting: Neurology

## 2015-11-27 VITALS — BP 145/100 | HR 78 | Resp 20 | Ht 71.0 in | Wt 226.0 lb

## 2015-11-27 DIAGNOSIS — Z72820 Sleep deprivation: Secondary | ICD-10-CM | POA: Insufficient documentation

## 2015-11-27 DIAGNOSIS — G4733 Obstructive sleep apnea (adult) (pediatric): Secondary | ICD-10-CM | POA: Diagnosis not present

## 2015-11-27 DIAGNOSIS — F515 Nightmare disorder: Secondary | ICD-10-CM

## 2015-11-27 DIAGNOSIS — G4726 Circadian rhythm sleep disorder, shift work type: Secondary | ICD-10-CM | POA: Diagnosis not present

## 2015-11-27 DIAGNOSIS — R0683 Snoring: Secondary | ICD-10-CM | POA: Insufficient documentation
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
## 2015-11-27 NOTE — Patient Instructions (Signed)

## 2015-11-28 ENCOUNTER — Encounter: Payer: Self-pay | Admitting: Family Medicine

## 2015-12-26 ENCOUNTER — Ambulatory Visit (INDEPENDENT_AMBULATORY_CARE_PROVIDER_SITE_OTHER): Payer: Commercial Managed Care - HMO | Admitting: Neurology

## 2015-12-26 DIAGNOSIS — G4726 Circadian rhythm sleep disorder, shift work type: Secondary | ICD-10-CM

## 2015-12-26 DIAGNOSIS — F515 Nightmare disorder: Secondary | ICD-10-CM

## 2015-12-26 DIAGNOSIS — G4733 Obstructive sleep apnea (adult) (pediatric): Secondary | ICD-10-CM

## 2015-12-26 DIAGNOSIS — Z72820 Sleep deprivation: Secondary | ICD-10-CM

## 2015-12-26 DIAGNOSIS — R0683 Snoring: Secondary | ICD-10-CM

## 2016-01-04 ENCOUNTER — Telehealth: Payer: Self-pay | Admitting: Neurology

## 2016-01-04 DIAGNOSIS — G4761 Periodic limb movement disorder: Secondary | ICD-10-CM

## 2016-01-04 DIAGNOSIS — G4733 Obstructive sleep apnea (adult) (pediatric): Secondary | ICD-10-CM

## 2016-01-04 DIAGNOSIS — G4726 Circadian rhythm sleep disorder, shift work type: Secondary | ICD-10-CM

## 2016-01-04 DIAGNOSIS — G4750 Parasomnia, unspecified: Secondary | ICD-10-CM
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
## 2016-01-08 ENCOUNTER — Encounter: Payer: Self-pay | Admitting: Family Medicine

## 2016-01-09 ENCOUNTER — Encounter: Payer: Self-pay | Admitting: *Deleted

## 2016-01-11 NOTE — Telephone Encounter (Signed)
LM for patient (per DPR). I left results and recommendations below. I advised that our sleep lab is working on getting his titration study approved through insurance. Once approved, sleep lab will call with appt for titrations study. Left call back # for further questions. Study sent to PCP.

## 2016-01-11 NOTE — Telephone Encounter (Signed)
-----   Message from Melvyn Novasarmen Dohmeier, MD sent at 01/04/2016  5:04 PM EST ----- He has apnea, PLms and snores loudly- we were not able to capture dream enactment.  He shall return for a CPAP titration (or chose autotitration CPAP ) depending on insurance and work up time restriction( 01-14-2016)

## 2016-02-10 ENCOUNTER — Ambulatory Visit (INDEPENDENT_AMBULATORY_CARE_PROVIDER_SITE_OTHER): Payer: Commercial Managed Care - HMO | Admitting: Neurology

## 2016-02-10 DIAGNOSIS — G4750 Parasomnia, unspecified: Secondary | ICD-10-CM

## 2016-02-10 DIAGNOSIS — G4761 Periodic limb movement disorder: Secondary | ICD-10-CM

## 2016-02-10 DIAGNOSIS — G4726 Circadian rhythm sleep disorder, shift work type: Secondary | ICD-10-CM

## 2016-02-10 DIAGNOSIS — G4733 Obstructive sleep apnea (adult) (pediatric): Secondary | ICD-10-CM | POA: Diagnosis not present

## 2016-02-14 ENCOUNTER — Telehealth: Payer: Self-pay | Admitting: Neurology

## 2016-02-14 DIAGNOSIS — G4733 Obstructive sleep apnea (adult) (pediatric): Secondary | ICD-10-CM

## 2016-02-14 DIAGNOSIS — G4761 Periodic limb movement disorder: Secondary | ICD-10-CM

## 2016-02-14 DIAGNOSIS — G4752 REM sleep behavior disorder: Secondary | ICD-10-CM

## 2016-02-14 NOTE — Procedures (Signed)
PATIENT'S NAME:  Nicholas Mckinney, Nicholas Mckinney DOB:      06/28/1970      MR#:    [id number]     DATE OF RECORDING: 02/10/2016 REFERRING M.D.:  Nicoletta BaPhilip [name] MD Study Performed:   CPAP  Titration HISTORY:  Nicholas Mckinney reported that he snores and on some periods he may have stopped breathing. He starts sleeping in supine position because of back pain, which may further promote apnea. He reports RLS, Insomnia, light sleep, parasomnia, sleep deprivation. A Diagnostic Polysomnogram was completed on 12/26/2015, resulted in :   1. Moderate severe Obstructive Sleep Apnea(OSA) with additional RERAs and snoring related arousals, and with borderline hypoxemia.  2. Severe Periodic Limb Movement Disorder (PLMD) 3. Thunderous Primary Snoring           4.   Nocturia , Intrusion of Sleep   The patient's weight 226 pounds with a height of 71 (inches), resulting in a BMI of 31.8 kg/m2. The patient's neck circumference measured 17.5 inches.  CURRENT MEDICATIONS: Aspirin, Synthroid, Antivert, Protonix, Paxil    PROCEDURE:  This is a multichannel digital polysomnogram utilizing the SomnoStar 11.2 system.  Electrodes and sensors were applied and monitored per AASM Specifications.   EEG, EOG, Chin and Limb EMG, were sampled at 200 Hz.  ECG, Snore and Nasal Pressure, Thermal Airflow, Respiratory Effort, CPAP Flow and Pressure, Oximetry was sampled at 50 Hz. Digital video and audio were recorded.       CPAP was initiated at 5 cmH20 with heated humidity per AASM split night standards and pressure was advanced to 8 cmH20 because of hypopneas, apneas and desaturations.  At a PAP pressure of 8 cmH20, there was a reduction of the AHI to 0 , improvement of obstructive sleep apnea. Sleep efficicency was 97 % at the final pressure, nadir SpO2 was 92%    Lights Out was at 20:25 and Lights On at 04:45. Total recording time (TRT) was 499 minutes, with a total sleep time (TST) of 291.5 minutes. The patient's sleep latency was 214.5 minutes with 0  minutes of wake time after sleep onset. REM latency was 37.5 minutes.    SLEEP ARCHITECTURE: WASO (Wake after sleep onset)  was 14 minutes.  There were 6.5 minutes in Stage N1, 228.5 minutes Stage N2, 48 minutes Stage N3 and 8.5 minutes in Stage REM.  The percentage of Stage N1 was 2.2%, Stage N2 was 78.4%, Stage N3 was 16.5% and Stage R (REM sleep) was 2.9%.    RESPIRATORY ANALYSIS:  There was a total of 16 respiratory events: 5 obstructive apneas, 0 central apneas and 0 mixed apneas with a total of 5 apneas and an apnea index (AI) of 1. /hour. There were 11 hypopneas with a hypopnea index of 2.3/hour. The patient also had 0 respiratory event related arousals (RERAs).      The total APNEA/HYPOPNEA INDEX  (AHI) was 3.3 /hour and the total RESPIRATORY DISTURBANCE INDEX was 3.3 .hour  1 events occurred in REM sleep and 15 events in NREM. The REM AHI was 7.1 /hour versus a non-REM AHI of 3.2 /hour.  The patient spent 160.5 minutes of total sleep time in the supine position and 131 minutes in non-supine. The supine AHI was 6.0, versus a non-supine AHI of 0.0.  OXYGEN SATURATION & C02:  The baseline 02 saturation was 94%, with the lowest being 90%. Time spent below 89% saturation equaled 0 minutes.  PERIODIC LIMB MOVEMENTS:    The patient had a total of 140 Periodic Limb  Movements. The Periodic Limb Movement (PLM) index was 28.8 and the PLM Arousal index was 4.9 /hour.  The arousals were noted as: 68 were spontaneous, 24 were associated with PLMs, 13 were associated with respiratory events. Some PLMs were still present in REM sleep.   Audio and video analysis did not show any abnormal or unusual movements, behaviors, phonations or vocalizations.     The patient took 2 bathroom breaks. Snoring was noted until CPAP pressure exceeded 7 cm water . EKG was in keeping with normal sinus rhythm (NSR).  The patient was fitted with a ReMed AirFit P 10 nasal pillow in  medium size .  DIAGNOSIS successful  CPAP titration to treat OSA with hypoxemia, persistent PLMs.   PLANS/RECOMMENDATIONS: 1. PLMS:  Exclude secondary factors for periodic limb movements of sleep.  Check ferritin, renal function, electrolytes, magnesium, calcium, B12, folate (treat deficiencies if present; serum ferritin values less than 50 ug/L should be treated with iron replacement, along with appropriate investigation for causes of iron deficiency).  Exclude any history or physical examination data supporting peripheral neuropathy.   2. Obstructive sleep apnea: Recommend a CPAP of 8 cm water , with followup at 2 to 3 months to assess response. Airfit p 10 medium , heated humifity.   3. Sleep hygiene measures should be carefully reviewed with the patient by the primary provider.  Check the bedroom environment for light, noise, temperature, or other factors which could disrupt sleep; use bedroom for sleeping and sex only; follow a regular schedule for sleeping and waking (goal is seven to eight hours sleep per night, but patient should not be in bed any time he or she does not feel like sleeping); limit naps to no more than one hour and no later than mid-afternoon; medically-supervised exercise plan, with exercise in late afternoon and no later than three hours prior to bedtime; avoid eating just before bed; avoid caffeine for six hours before bedtime; avoid alcohol; avoid tobacco; engage only in relaxing activities prior to bed, such as quiet music, reading under soft light, relaxation exercises such as deep breathing or progressive relaxation.  Avoid looking at alarm clock in bed; turn alarm clock facing away from patient.  If the patient worries in bed, one helpful intervention is to have the patient write down a list of concerns nightly before bed; and then to put them out of mind by discarding the list, after the patient confirms that there is nothing more he/she can do about them today.  A follow up appointment will be scheduled in the  Sleep Clinic at [facility].   Please call [phone] with any questions.      I certify that I have reviewed the entire raw data recording prior to the issuance of this report in accordance with the Standards of Accreditation of the American Academy of Sleep Medicine (AASM)      Melvyn Novas, M.D.  02-13-2016  Diplomat, American Board of Psychiatry and Neurology  Diplomat, American Board of Sleep Medicine Medical Director, [facility]

## 2016-02-14 NOTE — Telephone Encounter (Signed)
-----   Message from Melvyn Novasarmen Dohmeier, MD sent at 02/14/2016 10:24 AM EST ----- DIAGNOSIS successful CPAP titration to treat OSA with hypoxemia,  persistent PLMs.   PLANS/RECOMMENDATIONS: 1. PLMS: Exclude secondary factors for periodic limb movements  of sleep. Check ferritin, renal function, electrolytes,  magnesium, calcium, B12, folate (treat deficiencies if present;  serum ferritin values less than 50 ug/L should be treated with  iron replacement, along with appropriate investigation for causes  of iron deficiency). Exclude any history or physical examination  data supporting peripheral neuropathy.  2. Obstructive sleep apnea: Recommend a CPAP of 8 cm water , with  followup at 2 to 3 months to assess response. Airfit p 10 medium  , heated humifity.  3. Sleep hygiene measures should be carefully reviewed with the  patient by the primary provider. Check the bedroom environment  for light, noise, temperature, or other factors which could  disrupt sleep; use bedroom for sleeping and sex only; follow a  regular schedule for sleeping and waking (goal is seven to eight  hours sleep per night, but patient should not be in bed any time  he or she does not feel like sleeping); limit naps to no more  than one hour and no later than mid-afternoon;

## 2016-02-14 NOTE — Telephone Encounter (Signed)
DIAGNOSIS successful CPAP titration to treat OSA with hypoxemia,  persistent PLMs.   PLANS/RECOMMENDATIONS: 1. PLMS: Exclude secondary factors for periodic limb movements  of sleep. Check ferritin, renal function, electrolytes,  magnesium, calcium, B12, folate (treat deficiencies if present;  serum ferritin values less than 50 ug/L should be treated with  iron replacement, along with appropriate investigation for causes  of iron deficiency). Exclude any history or physical examination  data supporting peripheral neuropathy.  2. Obstructive sleep apnea: Recommend a CPAP of 8 cm water , with  followup at 2 to 3 months to assess response. Airfit p 10 medium  , heated humifity.  3. Sleep hygiene measures should be carefully reviewed with the  patient by the primary provider. Check the bedroom environment  for light, noise, temperature, or other factors which could  disrupt sleep; use bedroom for sleeping and sex only; follow a  regular schedule for sleeping and waking (goal is seven to eight  hours sleep per night, but patient should not be in bed any time  he or she does not feel like sleeping); limit naps to no more  than one hour and no later than mid-afternoon;

## 2016-02-14 NOTE — Telephone Encounter (Signed)
LM for patient to call back for results

## 2016-02-15 ENCOUNTER — Encounter: Payer: Self-pay | Admitting: Family Medicine

## 2016-02-15 NOTE — Telephone Encounter (Signed)
I called pt again to discuss sleep study results. No answer, left a message asking him to call me back. 

## 2016-02-20 NOTE — Telephone Encounter (Signed)
I called pt to discuss sleep study results. No answer, left a message asking him to call me back. This is the third attempt at contacting this pt by phone. I will send him a letter asking him to call us back.

## 2016-02-22 ENCOUNTER — Other Ambulatory Visit: Payer: Self-pay | Admitting: *Deleted

## 2016-02-22 MED ORDER — LEVOTHYROXINE SODIUM 75 MCG PO TABS: 75.0000 ug | ORAL_TABLET | Freq: Every day | ORAL | 0 refills | Status: DC

## 2016-02-22 NOTE — Telephone Encounter (Signed)
[facility].  RF request for levothyroxine LOV: 08/28/15 CPE Next ov: None Last written: 02/04/15 #90 w/ 3RF  08/28/15 TSH 1.06  Will send Rx for #90 w/ 0RF. Pt is due for f/u [facility]CI, needs office visit for more refills.

## 2016-02-23 NOTE — Telephone Encounter (Signed)
Patient is returning a call to discuss sleep results. He said a returned call on Monday would be fine and please leave a VM if not available to talk at [phone].

## 2016-02-26 ENCOUNTER — Ambulatory Visit: Payer: Commercial Managed Care - HMO | Admitting: Family Medicine

## 2016-02-28 NOTE — Telephone Encounter (Signed)
LM with results and recommendations. I asked the patient to call back and let us know if her would like to start treatment.

## 2016-02-29 NOTE — Telephone Encounter (Signed)
Patient's wife returning a call for sleep results.

## 2016-02-29 NOTE — Telephone Encounter (Signed)
I spoke to wife and she states that patient is willing to start treatment. We will send orders to [facility]. F/U appt was made for patient.

## 2016-03-05 ENCOUNTER — Other Ambulatory Visit: Payer: Self-pay | Admitting: *Deleted

## 2016-03-05 MED ORDER — PANTOPRAZOLE SODIUM 40 MG PO TBEC: 40.0000 mg | DELAYED_RELEASE_TABLET | Freq: Every day | ORAL | 6 refills | Status: DC

## 2016-03-05 NOTE — Telephone Encounter (Signed)
[facility].  RF request for pantoprazole LOV: 08/28/15 Next ov: None Last written: 02/03/15 #30 w/ 12RF

## 2016-03-15 ENCOUNTER — Encounter: Payer: Self-pay | Admitting: Neurology

## 2016-04-01 DIAGNOSIS — M545 Low back pain: Secondary | ICD-10-CM | POA: Diagnosis not present

## 2016-04-01 DIAGNOSIS — Z9889 Other specified postprocedural states: Secondary | ICD-10-CM | POA: Diagnosis not present

## 2016-04-01 DIAGNOSIS — M5432 Sciatica, left side: Secondary | ICD-10-CM | POA: Diagnosis not present

## 2016-04-04 DIAGNOSIS — G4733 Obstructive sleep apnea (adult) (pediatric): Secondary | ICD-10-CM | POA: Diagnosis not present

## 2016-04-08 DIAGNOSIS — M5137 Other intervertebral disc degeneration, lumbosacral region: Secondary | ICD-10-CM | POA: Diagnosis not present

## 2016-04-09 DIAGNOSIS — M5432 Sciatica, left side: Secondary | ICD-10-CM | POA: Diagnosis not present

## 2016-04-09 DIAGNOSIS — M5127 Other intervertebral disc displacement, lumbosacral region: Secondary | ICD-10-CM | POA: Diagnosis not present

## 2016-04-09 DIAGNOSIS — Z9889 Other specified postprocedural states: Secondary | ICD-10-CM | POA: Diagnosis not present

## 2016-04-11 ENCOUNTER — Other Ambulatory Visit: Payer: Self-pay | Admitting: Specialist

## 2016-04-11 DIAGNOSIS — M5127 Other intervertebral disc displacement, lumbosacral region: Secondary | ICD-10-CM

## 2016-04-19 ENCOUNTER — Ambulatory Visit
Admission: RE | Admit: 2016-04-19 | Discharge: 2016-04-19 | Disposition: A | Discharge status: Home / Self Care | Payer: Commercial Managed Care - HMO | Source: Ambulatory Visit | Attending: Specialist | Admitting: Specialist

## 2016-04-19 DIAGNOSIS — M5127 Other intervertebral disc displacement, lumbosacral region: Secondary | ICD-10-CM

## 2016-04-19 DIAGNOSIS — M5116 Intervertebral disc disorders with radiculopathy, lumbar region: Secondary | ICD-10-CM | POA: Diagnosis not present

## 2016-04-19 MED ORDER — METHYLPREDNISOLONE ACETATE 40 MG/ML INJ SUSP (RADIOLOG
120.0000 mg | Freq: Once | INTRAMUSCULAR | Status: AC
  Administered 2016-04-19: 120 mg via EPIDURAL

## 2016-04-19 MED ORDER — IOPAMIDOL (ISOVUE-M 200) INJECTION 41%
1.0000 mL | Freq: Once | INTRAMUSCULAR | Status: AC
  Administered 2016-04-19: 1 mL via EPIDURAL

## 2016-04-19 NOTE — Discharge Instructions (Signed)

## 2016-04-24 ENCOUNTER — Other Ambulatory Visit: Payer: Self-pay | Admitting: *Deleted

## 2016-04-24 MED ORDER — PAROXETINE HCL 20 MG PO TABS: 30.0000 mg | ORAL_TABLET | ORAL | 1 refills | Status: DC

## 2016-04-24 NOTE — Telephone Encounter (Signed)
[facility].  RF request for paroxetine LOV: 08/31/15 Next ov: 04/26/16 Last written: 04/20/15 #45 w/ 11RF

## 2016-04-26 ENCOUNTER — Encounter: Payer: Self-pay | Admitting: Family Medicine

## 2016-04-26 ENCOUNTER — Ambulatory Visit (INDEPENDENT_AMBULATORY_CARE_PROVIDER_SITE_OTHER): Payer: Commercial Managed Care - HMO | Admitting: Family Medicine

## 2016-04-26 VITALS — BP 139/86 | HR 72 | Temp 98.3°F | Resp 16 | Ht 71.0 in | Wt 222.8 lb

## 2016-04-26 DIAGNOSIS — F418 Other specified anxiety disorders: Secondary | ICD-10-CM | POA: Diagnosis not present

## 2016-04-26 DIAGNOSIS — F419 Anxiety disorder, unspecified: Principal | ICD-10-CM

## 2016-04-26 DIAGNOSIS — F329 Major depressive disorder, single episode, unspecified: Secondary | ICD-10-CM

## 2016-04-26 MED ORDER — DULOXETINE HCL 30 MG PO CPEP: 30.0000 mg | ORAL_CAPSULE | Freq: Every day | ORAL | 1 refills | Status: DC

## 2016-04-26 NOTE — Progress Notes (Signed)
Pre visit review using our clinic review tool, if applicable. No additional management support is needed unless otherwise documented below in the visit note. 

## 2016-04-26 NOTE — Patient Instructions (Signed)
Take 20 paroxetine (paxil) tab once daily x 3d, then take 1/2 of  paroxetine tab once daily x 3d, then stop this med.
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
## 2016-05-02 ENCOUNTER — Ambulatory Visit: Payer: Self-pay | Admitting: Neurology

## 2016-05-05 DIAGNOSIS — G4733 Obstructive sleep apnea (adult) (pediatric): Secondary | ICD-10-CM | POA: Diagnosis not present

## 2016-05-10 DIAGNOSIS — M5432 Sciatica, left side: Secondary | ICD-10-CM | POA: Diagnosis not present

## 2016-05-10 DIAGNOSIS — M545 Low back pain: Secondary | ICD-10-CM | POA: Diagnosis not present

## 2016-05-17 ENCOUNTER — Telehealth: Payer: Self-pay | Admitting: Family Medicine

## 2016-05-17 NOTE — Telephone Encounter (Signed)
Please advise. Thanks.  

## 2016-05-17 NOTE — Telephone Encounter (Signed)
Patient states that he wants to know if the new antidepressant medication could be making him sick to his stomach?   Patient states he is having nausea and his stomach just hurts.   He was just curious if it could be the medication making him feel this way?  He states it is okay to leave a detailed message if we don't get him on the phone.  Please advise.

## 2016-05-17 NOTE — Telephone Encounter (Signed)
Yes, it could cause upset stomach, but usually this wears off after taking it daily for a couple of weeks. I recommend he try decreasing to one cap every other day for 3 doses, then stop the medication for 3 consecutive days to see if his symptoms improve or go away. Have him call after this to let me know how things go.-thx

## 2016-05-17 NOTE — Telephone Encounter (Signed)
Pt advised and voiced understanding.   

## 2016-05-22 ENCOUNTER — Other Ambulatory Visit: Payer: Self-pay | Admitting: Family Medicine

## 2016-05-24 ENCOUNTER — Ambulatory Visit (INDEPENDENT_AMBULATORY_CARE_PROVIDER_SITE_OTHER): Payer: Commercial Managed Care - HMO | Admitting: Family Medicine

## 2016-05-24 ENCOUNTER — Encounter: Payer: Self-pay | Admitting: Family Medicine

## 2016-05-24 VITALS — BP 117/78 | HR 66 | Temp 98.6°F | Resp 16 | Ht 71.0 in | Wt 219.2 lb

## 2016-05-24 DIAGNOSIS — F329 Major depressive disorder, single episode, unspecified: Secondary | ICD-10-CM | POA: Diagnosis not present

## 2016-05-24 DIAGNOSIS — F419 Anxiety disorder, unspecified: Secondary | ICD-10-CM | POA: Diagnosis not present

## 2016-05-24 DIAGNOSIS — T887XXA Unspecified adverse effect of drug or medicament, initial encounter: Secondary | ICD-10-CM

## 2016-05-24 DIAGNOSIS — F32A Depression, unspecified: Secondary | ICD-10-CM
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
## 2016-06-04 DIAGNOSIS — G4733 Obstructive sleep apnea (adult) (pediatric): Secondary | ICD-10-CM | POA: Diagnosis not present

## 2016-06-25 ENCOUNTER — Other Ambulatory Visit: Payer: Self-pay | Admitting: Specialist

## 2016-06-25 DIAGNOSIS — M545 Low back pain, unspecified: Secondary | ICD-10-CM

## 2016-06-27 ENCOUNTER — Ambulatory Visit
Admission: RE | Admit: 2016-06-27 | Discharge: 2016-06-27 | Disposition: A | Discharge status: Home / Self Care | Payer: 59 | Source: Ambulatory Visit | Attending: Specialist | Admitting: Specialist

## 2016-06-27 DIAGNOSIS — M5116 Intervertebral disc disorders with radiculopathy, lumbar region: Secondary | ICD-10-CM | POA: Diagnosis not present

## 2016-06-27 DIAGNOSIS — M545 Low back pain, unspecified: Secondary | ICD-10-CM

## 2016-06-27 MED ORDER — IOPAMIDOL (ISOVUE-M 200) INJECTION 41%
1.0000 mL | Freq: Once | INTRAMUSCULAR | Status: AC
  Administered 2016-06-27: 1 mL via EPIDURAL

## 2016-06-27 MED ORDER — METHYLPREDNISOLONE ACETATE 40 MG/ML INJ SUSP (RADIOLOG
120.0000 mg | Freq: Once | INTRAMUSCULAR | Status: AC
  Administered 2016-06-27: 120 mg via EPIDURAL

## 2016-06-27 NOTE — Discharge Instructions (Signed)

## 2016-07-05 DIAGNOSIS — G4733 Obstructive sleep apnea (adult) (pediatric): Secondary | ICD-10-CM | POA: Diagnosis not present

## 2016-07-15 DIAGNOSIS — Z9889 Other specified postprocedural states: Secondary | ICD-10-CM | POA: Diagnosis not present

## 2016-07-15 DIAGNOSIS — M545 Low back pain: Secondary | ICD-10-CM | POA: Diagnosis not present

## 2016-07-15 DIAGNOSIS — M5137 Other intervertebral disc degeneration, lumbosacral region: Secondary | ICD-10-CM | POA: Diagnosis not present

## 2016-07-19 ENCOUNTER — Other Ambulatory Visit: Payer: Self-pay | Admitting: Family Medicine

## 2016-07-19 NOTE — Telephone Encounter (Signed)
[facility].  RF request for paroxetine LOV: 05/24/16 Next ov: None Last written: 04/24/16 #45 w/ 1RF

## 2016-07-24 ENCOUNTER — Ambulatory Visit: Payer: Self-pay | Admitting: Specialist

## 2016-07-25 NOTE — Patient Instructions (Signed)
Clovis RileyMichael S Ayo  07/25/2016   Your procedure is scheduled on: 07/31/16  [facility]  [facility]trance [place]  [place]vators to 3rd floor to  Short Stay Center at     0630 AM.    Call this number if you have problems the morning of surgery [phone]   Remember: ONLY 1 PERSON MAY GO WITH YOU TO SHORT STAY TO GET  READY MORNING OF YOUR SURGERY.  Do not eat food or drink liquids :After Midnight.     Take these medicines the morning of surgery with A SIP OF WATER: Paxil, pantoprazole, synthroid,                                You may not have any metal on your body including hair pins and              piercings  Do not wear jewelry, , lotions, powders or perfumes, deodorant                         Men may shave face and neck.   Do not bring valuables to the hospital. [facility] IS NOT             RESPONSIBLE   FOR VALUABLES.  Contacts, dentures or bridgework may not be worn into surgery.  Leave suitcase in the car. After surgery it may be brought to your room.                 Please read over the following fact sheets you were given: _____________________________________________________________________            [facility] - Preparing [facility] Before surgery, you can play an important role.  Because skin is not sterile, your skin needs to be as free of germs as possible.  You can reduce the number of germs on your skin by washing with CHG (chlorahexidine gluconate) soap before surgery.  CHG is an antiseptic cleaner which kills germs and bonds with the skin to continue killing germs even after washing. Please DO NOT use if you have an allergy to CHG or antibacterial soaps.  If your skin becomes reddened/irritated stop using the CHG and inform your nurse when you arrive at Short Stay. Do not shave (including legs and underarms) for at least 48 hours prior to the first CHG shower.  You may shave your face/neck. Please follow these instructions  carefully:  1.  Shower with CHG Soap the night before surgery and the  morning of Surgery.  2.  If you choose to wash your hair, wash your hair first as usual with your  normal  shampoo.  3.  After you shampoo, rinse your hair and body thoroughly to remove the  shampoo.                           4.  Use CHG as you would any other liquid soap.  You can apply chg directly  to the skin and wash                       Gently with a scrungie or clean washcloth.  5.  Apply the CHG Soap to your body ONLY FROM THE NECK DOWN.  Do not use on face/ open                           Wound or open sores. Avoid contact with eyes, ears mouth and genitals (private parts).                       Wash face,  Genitals (private parts) with your normal soap.             6.  Wash thoroughly, paying special attention to the area where your surgery  will be performed.  7.  Thoroughly rinse your body with warm water from the neck down.  8.  DO NOT shower/wash with your normal soap after using and rinsing off  the CHG Soap.                9.  Pat yourself dry with a clean towel.            10.  Wear clean pajamas.            11.  Place clean sheets on your bed the night of your first shower and do not  sleep with pets. Day of Surgery : Do not apply any lotions/deodorants the morning of surgery.  Please wear clean clothes to the hospital/surgery center.  FAILURE TO FOLLOW THESE INSTRUCTIONS MAY RESULT IN THE CANCELLATION OF YOUR SURGERY PATIENT SIGNATURE_________________________________  NURSE SIGNATURE__________________________________  ________________________________________________________________________   Adam Phenix  An incentive spirometer is a tool that can help keep your lungs clear and active. This tool measures how well you are filling your lungs with each breath. Taking long deep breaths may help reverse or decrease the chance of developing breathing (pulmonary) problems (especially infection)  following:  A long period of time when you are unable to move or be active. BEFORE THE PROCEDURE   If the spirometer includes an indicator to show your best effort, your nurse or respiratory therapist will set it to a desired goal.  If possible, sit up straight or lean slightly forward. Try not to slouch.  Hold the incentive spirometer in an upright position. INSTRUCTIONS FOR USE  1. Sit on the edge of your bed if possible, or sit up as far as you can in bed or on a chair. 2. Hold the incentive spirometer in an upright position. 3. Breathe out normally. 4. Place the mouthpiece in your mouth and seal your lips tightly around it. 5. Breathe in slowly and as deeply as possible, raising the piston or the ball toward the top of the column. 6. Hold your breath for 3-5 seconds or for as long as possible. Allow the piston or ball to fall to the bottom of the column. 7. Remove the mouthpiece from your mouth and breathe out normally. 8. Rest for a few seconds and repeat Steps 1 through 7 at least 10 times every 1-2 hours when you are awake. Take your time and take a few normal breaths between deep breaths. 9. The spirometer may include an indicator to show your best effort. Use the indicator as a goal to work toward during each repetition. 10. After each set of 10 deep breaths, practice coughing to be sure your lungs are clear. If you have an incision (the cut made at the time of surgery), support your incision when coughing by placing a pillow or rolled up towels firmly against it. Once you are able to get out of  bed, walk around indoors and cough well. You may stop using the incentive spirometer when instructed by your caregiver.  RISKS AND COMPLICATIONS  Take your time so you do not get dizzy or light-headed.  If you are in pain, you may need to take or ask for pain medication before doing incentive spirometry. It is harder to take a deep breath if you are having pain. AFTER USE  Rest and  breathe slowly and easily.  It can be helpful to keep track of a log of your progress. Your caregiver can provide you with a simple table to help with this. If you are using the spirometer at home, follow these instructions: [place] IF:   You are having difficultly using the spirometer.  You have trouble using the spirometer as often as instructed.  Your pain medication is not giving enough relief while using the spirometer.  You develop fever of 100.5 F (38.1 C) or higher. SEEK IMMEDIATE MEDICAL CARE IF:   You cough up bloody sputum that had not been present before.  You develop fever of 102 F (38.9 C) or greater.  You develop worsening pain at or near the incision site. MAKE SURE YOU:   Understand these instructions.  Will watch your condition.  Will get help right away if you are not doing well or get worse. Document Released: 05/13/2006 Document Revised: 03/25/2011 Document Reviewed: 07/14/2006 [facility] Patient Information 2014 [place], [place].   ________________________________________________________________________

## 2016-07-26 ENCOUNTER — Ambulatory Visit (HOSPITAL_COMMUNITY)
Admission: RE | Admit: 2016-07-26 | Discharge: 2016-07-26 | Disposition: A | Discharge status: Home / Self Care | Payer: 59 | Source: Ambulatory Visit | Attending: Specialist | Admitting: Specialist

## 2016-07-26 ENCOUNTER — Encounter (HOSPITAL_COMMUNITY)
Admission: RE | Admit: 2016-07-26 | Discharge: 2016-07-26 | Disposition: A | Discharge status: Home / Self Care | Payer: 59 | Source: Ambulatory Visit | Attending: Specialist | Admitting: Specialist

## 2016-07-26 ENCOUNTER — Encounter (HOSPITAL_COMMUNITY): Payer: Self-pay

## 2016-07-26 DIAGNOSIS — M5126 Other intervertebral disc displacement, lumbar region: Secondary | ICD-10-CM | POA: Insufficient documentation

## 2016-07-26 DIAGNOSIS — M5137 Other intervertebral disc degeneration, lumbosacral region: Secondary | ICD-10-CM | POA: Diagnosis not present

## 2016-07-26 DIAGNOSIS — M4807 Spinal stenosis, lumbosacral region: Secondary | ICD-10-CM | POA: Diagnosis not present

## 2016-07-26 DIAGNOSIS — Z01818 Encounter for other preprocedural examination: Secondary | ICD-10-CM | POA: Insufficient documentation

## 2016-07-26 DIAGNOSIS — M5136 Other intervertebral disc degeneration, lumbar region: Secondary | ICD-10-CM | POA: Diagnosis not present

## 2016-07-26 HISTORY — DX: Pneumonia, unspecified organism: J18.9

## 2016-07-26 LAB — CBC
HEMATOCRIT: 41.6 % (ref 39.0–52.0)
HEMOGLOBIN: 14.6 g/dL (ref 13.0–17.0)
MCH: 31 pg (ref 26.0–34.0)
MCHC: 35.1 g/dL (ref 30.0–36.0)
MCV: 88.3 fL (ref 78.0–100.0)
Platelets: 395 10*3/uL (ref 150–400)
RBC: 4.71 MIL/uL (ref 4.22–5.81)
RDW: 13.1 % (ref 11.5–15.5)
WBC: 14.5 10*3/uL — ABNORMAL HIGH (ref 4.0–10.5)

## 2016-07-26 LAB — BASIC METABOLIC PANEL
ANION GAP: 10 (ref 5–15)
BUN: 17 mg/dL (ref 6–20)
CO2: 28 mmol/L (ref 22–32)
Calcium: 9.9 mg/dL (ref 8.9–10.3)
Chloride: 101 mmol/L (ref 101–111)
Creatinine, Ser: 1.02 mg/dL (ref 0.61–1.24)
GFR calc Af Amer: >60 mL/min (ref >60)
GLUCOSE: 94 mg/dL (ref 65–99)
POTASSIUM: 4.7 mmol/L (ref 3.5–5.1)
Sodium: 139 mmol/L (ref 135–145)

## 2016-07-26 LAB — SURGICAL PCR SCREEN
MRSA, PCR: NEGATIVE
STAPHYLOCOCCUS AUREUS: POSITIVE — AB

## 2016-07-26 NOTE — Progress Notes (Signed)
Cbc done 07/26/16 routed to Dr. Shelle IronBeane via epic

## 2016-07-29 ENCOUNTER — Ambulatory Visit: Payer: Self-pay | Admitting: Orthopedic Surgery
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
## 2016-07-30 NOTE — Anesthesia Preprocedure Evaluation (Signed)
Anesthesia Evaluation  Patient identified by MRN, date of birth, ID band Patient awake    Reviewed: Allergy & Precautions, H&P , Patient's Chart, lab work & pertinent test results, reviewed documented beta blocker date and time   Airway Mallampati: II  TM Distance: >3 FB Neck ROM: full    Dental no notable dental hx.    Pulmonary sleep apnea and Continuous Positive Airway Pressure Ventilation , former smoker,    Pulmonary exam normal breath sounds clear to auscultation       Cardiovascular  Rhythm:regular Rate:Normal     Neuro/Psych    GI/Hepatic   Endo/Other    Renal/GU      Musculoskeletal   Abdominal   Peds  Hematology   Anesthesia Other Findings   Reproductive/Obstetrics                             Anesthesia Physical Anesthesia Plan  ASA: III  Anesthesia Plan: General   Post-op Pain Management:    Induction: Intravenous  PONV Risk Score and Plan:   Airway Management Planned: Oral ETT  Additional Equipment:   Intra-op Plan:   Post-operative Plan: Extubation in OR  Informed Consent: I have reviewed the patients History and Physical, chart, labs and discussed the procedure including the risks, benefits and alternatives for the proposed anesthesia with the patient or authorized representative who has indicated his/her understanding and acceptance.   Dental Advisory Given  Plan Discussed with: CRNA and Surgeon  Anesthesia Plan Comments: (  )        Anesthesia Quick Evaluation

## 2016-07-31 ENCOUNTER — Ambulatory Visit (HOSPITAL_COMMUNITY): Payer: 59

## 2016-07-31 ENCOUNTER — Encounter (HOSPITAL_COMMUNITY)
Admission: RE | Disposition: A | Discharge status: Home / Self Care | Payer: Self-pay | Source: Ambulatory Visit | Attending: Specialist

## 2016-07-31 ENCOUNTER — Ambulatory Visit (HOSPITAL_COMMUNITY)
Admission: RE | Admit: 2016-07-31 | Discharge: 2016-07-31 | Disposition: A | Discharge status: Home / Self Care | Payer: 59 | Source: Ambulatory Visit | Attending: Specialist | Admitting: Specialist

## 2016-07-31 ENCOUNTER — Encounter (HOSPITAL_COMMUNITY): Payer: Self-pay | Admitting: *Deleted

## 2016-07-31 ENCOUNTER — Ambulatory Visit (HOSPITAL_COMMUNITY): Payer: 59 | Admitting: Anesthesiology

## 2016-07-31 DIAGNOSIS — M5117 Intervertebral disc disorders with radiculopathy, lumbosacral region: Secondary | ICD-10-CM | POA: Diagnosis not present

## 2016-07-31 DIAGNOSIS — M5126 Other intervertebral disc displacement, lumbar region: Secondary | ICD-10-CM

## 2016-07-31 DIAGNOSIS — M4807 Spinal stenosis, lumbosacral region: Secondary | ICD-10-CM | POA: Diagnosis not present

## 2016-07-31 DIAGNOSIS — F17220 Nicotine dependence, chewing tobacco, uncomplicated: Secondary | ICD-10-CM | POA: Diagnosis not present

## 2016-07-31 DIAGNOSIS — G4733 Obstructive sleep apnea (adult) (pediatric): Secondary | ICD-10-CM | POA: Diagnosis not present

## 2016-07-31 DIAGNOSIS — Z9989 Dependence on other enabling machines and devices: Secondary | ICD-10-CM | POA: Diagnosis not present

## 2016-07-31 DIAGNOSIS — Z419 Encounter for procedure for purposes other than remedying health state, unspecified: Secondary | ICD-10-CM

## 2016-07-31 DIAGNOSIS — M48062 Spinal stenosis, lumbar region with neurogenic claudication: Secondary | ICD-10-CM | POA: Diagnosis not present

## 2016-07-31 DIAGNOSIS — M549 Dorsalgia, unspecified: Secondary | ICD-10-CM | POA: Diagnosis present

## 2016-07-31 DIAGNOSIS — M5127 Other intervertebral disc displacement, lumbosacral region: Secondary | ICD-10-CM | POA: Diagnosis not present

## 2016-07-31 DIAGNOSIS — K5790 Diverticulosis of intestine, part unspecified, without perforation or abscess without bleeding: Secondary | ICD-10-CM | POA: Diagnosis not present

## 2016-07-31 DIAGNOSIS — M5136 Other intervertebral disc degeneration, lumbar region: Secondary | ICD-10-CM | POA: Diagnosis not present

## 2016-07-31 HISTORY — PX: LUMBAR LAMINECTOMY/DECOMPRESSION MICRODISCECTOMY: SHX5026

## 2016-07-31 SURGERY — LUMBAR LAMINECTOMY/DECOMPRESSION MICRODISCECTOMY 1 LEVEL
Anesthesia: General | Site: Back

## 2016-07-31 MED ORDER — ONDANSETRON HCL 4 MG/2ML IJ SOLN
INTRAMUSCULAR | Status: AC
  Filled 2016-07-31: qty 2

## 2016-07-31 MED ORDER — EPHEDRINE 5 MG/ML INJ
INTRAVENOUS | Status: AC
  Filled 2016-07-31: qty 10

## 2016-07-31 MED ORDER — KCL IN DEXTROSE-NACL 20-5-0.45 MEQ/L-%-% IV SOLN
INTRAVENOUS | Status: DC
  Administered 2016-07-31: 50 mL/h via INTRAVENOUS
  Filled 2016-07-31: qty 1000

## 2016-07-31 MED ORDER — METHOCARBAMOL 500 MG PO TABS: 500.0000 mg | ORAL_TABLET | Freq: Four times a day (QID) | ORAL | 1 refills | Status: DC | PRN

## 2016-07-31 MED ORDER — PROPOFOL 10 MG/ML IV BOLUS
INTRAVENOUS | Status: AC
  Filled 2016-07-31: qty 20

## 2016-07-31 MED ORDER — SUCCINYLCHOLINE CHLORIDE 200 MG/10ML IV SOSY
PREFILLED_SYRINGE | INTRAVENOUS | Status: AC
  Filled 2016-07-31: qty 10

## 2016-07-31 MED ORDER — GABAPENTIN 300 MG PO CAPS: 300.0000 mg | ORAL_CAPSULE | Freq: Three times a day (TID) | ORAL | Status: DC | PRN

## 2016-07-31 MED ORDER — THROMBIN 5000 UNITS EX SOLR
CUTANEOUS | Status: AC
  Filled 2016-07-31: qty 10000

## 2016-07-31 MED ORDER — MIDAZOLAM HCL 2 MG/2ML IJ SOLN
INTRAMUSCULAR | Status: AC
  Filled 2016-07-31: qty 2

## 2016-07-31 MED ORDER — ONDANSETRON HCL 4 MG/2ML IJ SOLN
INTRAMUSCULAR | Status: DC | PRN
  Administered 2016-07-31: 4 mg via INTRAVENOUS

## 2016-07-31 MED ORDER — LACTATED RINGERS IV SOLN
INTRAVENOUS | Status: DC
  Administered 2016-07-31 (×2): via INTRAVENOUS

## 2016-07-31 MED ORDER — LIDOCAINE 2% (20 MG/ML) 5 ML SYRINGE
INTRAMUSCULAR | Status: DC | PRN
  Administered 2016-07-31: 100 mg via INTRAVENOUS

## 2016-07-31 MED ORDER — SUFENTANIL CITRATE 50 MCG/ML IV SOLN
INTRAVENOUS | Status: DC | PRN
Start: 2016-07-31 — End: 2016-07-31
  Administered 2016-07-31: 10 ug via INTRAVENOUS
  Administered 2016-07-31: 20 ug via INTRAVENOUS

## 2016-07-31 MED ORDER — HYDROCODONE-ACETAMINOPHEN 10-325 MG PO TABS: 1.0000 | ORAL_TABLET | ORAL | 0 refills | Status: DC | PRN

## 2016-07-31 MED ORDER — ONDANSETRON HCL 4 MG PO TABS: 4.0000 mg | ORAL_TABLET | Freq: Four times a day (QID) | ORAL | Status: DC | PRN

## 2016-07-31 MED ORDER — SODIUM CHLORIDE 0.9 % IV SOLN
1500.0000 mg | Freq: Once | INTRAVENOUS | Status: DC
  Filled 2016-07-31: qty 1500

## 2016-07-31 MED ORDER — HYDROMORPHONE HCL-NACL 0.5-0.9 MG/ML-% IV SOSY: 1.0000 mg | PREFILLED_SYRINGE | INTRAVENOUS | Status: DC | PRN

## 2016-07-31 MED ORDER — SUFENTANIL CITRATE 50 MCG/ML IV SOLN
INTRAVENOUS | Status: AC
  Filled 2016-07-31: qty 1

## 2016-07-31 MED ORDER — SODIUM CHLORIDE 0.9 % IV SOLN
INTRAVENOUS | Status: AC
  Filled 2016-07-31: qty 500000

## 2016-07-31 MED ORDER — PAROXETINE HCL 20 MG PO TABS: 20.0000 mg | ORAL_TABLET | Freq: Every day | ORAL | Status: DC

## 2016-07-31 MED ORDER — LEVOTHYROXINE SODIUM 75 MCG PO TABS: 75.0000 ug | ORAL_TABLET | Freq: Every day | ORAL | Status: DC

## 2016-07-31 MED ORDER — BUPIVACAINE-EPINEPHRINE (PF) 0.5% -1:200000 IJ SOLN
INTRAMUSCULAR | Status: DC | PRN
  Administered 2016-07-31: 20 mL

## 2016-07-31 MED ORDER — DOCUSATE SODIUM 100 MG PO CAPS
100.0000 mg | ORAL_CAPSULE | Freq: Two times a day (BID) | ORAL | 1 refills | Status: DC | PRN
Start: 2016-07-31 — End: 2016-09-09

## 2016-07-31 MED ORDER — ASPIRIN 81 MG PO CHEW: 81.0000 mg | CHEWABLE_TABLET | Freq: Every day | ORAL | Status: DC

## 2016-07-31 MED ORDER — VANCOMYCIN HCL IN DEXTROSE 1-5 GM/200ML-% IV SOLN
1000.0000 mg | INTRAVENOUS | Status: AC
  Administered 2016-07-31: 1000 mg via INTRAVENOUS
  Filled 2016-07-31: qty 200

## 2016-07-31 MED ORDER — PHENOL 1.4 % MT LIQD
1.0000 | OROMUCOSAL | Status: DC | PRN
  Filled 2016-07-31: qty 177

## 2016-07-31 MED ORDER — SUGAMMADEX SODIUM 200 MG/2ML IV SOLN
INTRAVENOUS | Status: DC | PRN
  Administered 2016-07-31: 200 mg via INTRAVENOUS

## 2016-07-31 MED ORDER — SUGAMMADEX SODIUM 200 MG/2ML IV SOLN
INTRAVENOUS | Status: AC
  Filled 2016-07-31: qty 2

## 2016-07-31 MED ORDER — MENTHOL 3 MG MT LOZG: 1.0000 | LOZENGE | OROMUCOSAL | Status: DC | PRN

## 2016-07-31 MED ORDER — MIDAZOLAM HCL 2 MG/2ML IJ SOLN
INTRAMUSCULAR | Status: DC | PRN
  Administered 2016-07-31: 2 mg via INTRAVENOUS

## 2016-07-31 MED ORDER — PANTOPRAZOLE SODIUM 40 MG PO TBEC: 40.0000 mg | DELAYED_RELEASE_TABLET | Freq: Every day | ORAL | Status: DC

## 2016-07-31 MED ORDER — SODIUM CHLORIDE 0.9 % IJ SOLN
INTRAMUSCULAR | Status: AC
  Filled 2016-07-31: qty 10

## 2016-07-31 MED ORDER — METHOCARBAMOL 1000 MG/10ML IJ SOLN
500.0000 mg | Freq: Four times a day (QID) | INTRAVENOUS | Status: DC | PRN
  Administered 2016-07-31: 500 mg via INTRAVENOUS
  Filled 2016-07-31: qty 5

## 2016-07-31 MED ORDER — METHOCARBAMOL 500 MG PO TABS
500.0000 mg | ORAL_TABLET | Freq: Four times a day (QID) | ORAL | Status: DC | PRN
Start: 2016-07-31 — End: 2016-07-31

## 2016-07-31 MED ORDER — ACETAMINOPHEN 325 MG PO TABS
650.0000 mg | ORAL_TABLET | ORAL | Status: DC | PRN
Start: 2016-07-31 — End: 2016-07-31

## 2016-07-31 MED ORDER — HYDROMORPHONE HCL-NACL 0.5-0.9 MG/ML-% IV SOSY
PREFILLED_SYRINGE | INTRAVENOUS | Status: AC
  Administered 2016-07-31: 0.5 mg via INTRAVENOUS
  Filled 2016-07-31: qty 2

## 2016-07-31 MED ORDER — BISACODYL 5 MG PO TBEC: 5.0000 mg | DELAYED_RELEASE_TABLET | Freq: Every day | ORAL | Status: DC | PRN

## 2016-07-31 MED ORDER — GELATIN ABSORBABLE MT POWD
OROMUCOSAL | Status: DC | PRN
  Administered 2016-07-31: 10:00:00 via TOPICAL

## 2016-07-31 MED ORDER — ONDANSETRON HCL 4 MG/2ML IJ SOLN: 4.0000 mg | Freq: Four times a day (QID) | INTRAMUSCULAR | Status: DC | PRN

## 2016-07-31 MED ORDER — MAGNESIUM CITRATE PO SOLN: 1.0000 | Freq: Once | ORAL | Status: DC | PRN

## 2016-07-31 MED ORDER — HYDROCODONE-ACETAMINOPHEN 10-325 MG PO TABS
1.0000 | ORAL_TABLET | ORAL | Status: DC | PRN
  Administered 2016-07-31 (×2): 1 via ORAL
  Filled 2016-07-31 (×2): qty 1

## 2016-07-31 MED ORDER — CHLORHEXIDINE GLUCONATE 4 % EX LIQD: 60.0000 mL | Freq: Once | CUTANEOUS | Status: DC

## 2016-07-31 MED ORDER — ALUM & MAG HYDROXIDE-SIMETH 200-200-20 MG/5ML PO SUSP: 30.0000 mL | Freq: Four times a day (QID) | ORAL | Status: DC | PRN

## 2016-07-31 MED ORDER — DEXAMETHASONE SODIUM PHOSPHATE 10 MG/ML IJ SOLN
INTRAMUSCULAR | Status: AC
  Filled 2016-07-31: qty 1

## 2016-07-31 MED ORDER — PROPOFOL 10 MG/ML IV BOLUS
INTRAVENOUS | Status: DC | PRN
  Administered 2016-07-31: 200 mg via INTRAVENOUS

## 2016-07-31 MED ORDER — HYDROMORPHONE HCL-NACL 0.5-0.9 MG/ML-% IV SOSY
0.2500 mg | PREFILLED_SYRINGE | INTRAVENOUS | Status: DC | PRN
  Administered 2016-07-31 (×2): 0.5 mg via INTRAVENOUS

## 2016-07-31 MED ORDER — ACETAMINOPHEN 650 MG RE SUPP
650.0000 mg | RECTAL | Status: DC | PRN
Start: 2016-07-31 — End: 2016-07-31

## 2016-07-31 MED ORDER — SODIUM CHLORIDE 0.9 % IV SOLN
INTRAVENOUS | Status: DC | PRN
  Administered 2016-07-31: 500 mL

## 2016-07-31 MED ORDER — DOCUSATE SODIUM 100 MG PO CAPS: 100.0000 mg | ORAL_CAPSULE | Freq: Two times a day (BID) | ORAL | Status: DC

## 2016-07-31 MED ORDER — RISAQUAD PO CAPS: 1.0000 | ORAL_CAPSULE | Freq: Every day | ORAL | Status: DC

## 2016-07-31 MED ORDER — BUPIVACAINE-EPINEPHRINE (PF) 0.5% -1:200000 IJ SOLN
INTRAMUSCULAR | Status: AC
  Filled 2016-07-31: qty 30

## 2016-07-31 MED ORDER — LIDOCAINE 2% (20 MG/ML) 5 ML SYRINGE
INTRAMUSCULAR | Status: AC
  Filled 2016-07-31: qty 5

## 2016-07-31 MED ORDER — ROCURONIUM BROMIDE 10 MG/ML (PF) SYRINGE
PREFILLED_SYRINGE | INTRAVENOUS | Status: DC | PRN
  Administered 2016-07-31: 20 mg via INTRAVENOUS
  Administered 2016-07-31: 50 mg via INTRAVENOUS

## 2016-07-31 MED ORDER — ROCURONIUM BROMIDE 50 MG/5ML IV SOSY
PREFILLED_SYRINGE | INTRAVENOUS | Status: AC
  Filled 2016-07-31: qty 5

## 2016-07-31 MED ORDER — DEXAMETHASONE SODIUM PHOSPHATE 10 MG/ML IJ SOLN
INTRAMUSCULAR | Status: DC | PRN
Start: 2016-07-31 — End: 2016-07-31
  Administered 2016-07-31: 10 mg via INTRAVENOUS

## 2016-07-31 MED ORDER — POLYETHYLENE GLYCOL 3350 17 G PO PACK: 17.0000 g | PACK | Freq: Every day | ORAL | 0 refills | Status: DC

## 2016-07-31 MED ORDER — EPHEDRINE SULFATE-NACL 50-0.9 MG/10ML-% IV SOSY
PREFILLED_SYRINGE | INTRAVENOUS | Status: DC | PRN
  Administered 2016-07-31: 5 mg via INTRAVENOUS

## 2016-07-31 MED ORDER — POLYETHYLENE GLYCOL 3350 17 G PO PACK: 17.0000 g | PACK | Freq: Every day | ORAL | Status: DC | PRN

## 2016-07-31 SURGICAL SUPPLY — 51 items
AGENT HMST SPONGE THK3/8 (HEMOSTASIS)
BAG SPEC THK2 15X12 ZIP CLS (MISCELLANEOUS)
BAG ZIPLOCK 12X15 (MISCELLANEOUS) IMPLANT
CLEANER TIP ELECTROSURG 2X2 (MISCELLANEOUS) ×3 IMPLANT
CLOSURE WOUND 1/2 X4 (GAUZE/BANDAGES/DRESSINGS) ×1
CLOTH 2% CHLOROHEXIDINE 3PK (PERSONAL CARE ITEMS) ×3 IMPLANT
COVER SURGICAL LIGHT HANDLE (MISCELLANEOUS) ×3 IMPLANT
DRAPE MICROSCOPE LEICA (MISCELLANEOUS) ×3 IMPLANT
DRAPE POUCH INSTRU U-SHP 10X18 (DRAPES) ×3 IMPLANT
DRAPE SHEET LG 3/4 BI-LAMINATE (DRAPES) ×3 IMPLANT
DRAPE SURG 17X11 SM STRL (DRAPES) ×3 IMPLANT
DRAPE UTILITY XL STRL (DRAPES) ×3 IMPLANT
DRSG AQUACEL AG ADV 3.5X 4 (GAUZE/BANDAGES/DRESSINGS) ×2 IMPLANT
DRSG AQUACEL AG ADV 3.5X 6 (GAUZE/BANDAGES/DRESSINGS) IMPLANT
DURAPREP 26ML APPLICATOR (WOUND CARE) ×3 IMPLANT
DURASEAL SPINE SEALANT 3ML (MISCELLANEOUS) IMPLANT
ELECT BLADE TIP CTD 4 INCH (ELECTRODE) IMPLANT
ELECT REM PT RETURN 15FT ADLT (MISCELLANEOUS) ×3 IMPLANT
GLOVE BIOGEL PI IND STRL 7.0 (GLOVE) ×1 IMPLANT
GLOVE BIOGEL PI INDICATOR 7.0 (GLOVE) ×2
GLOVE SURG SS PI 7.0 STRL IVOR (GLOVE) ×7 IMPLANT
GLOVE SURG SS PI 7.5 STRL IVOR (GLOVE) ×3 IMPLANT
GLOVE SURG SS PI 8.0 STRL IVOR (GLOVE) ×6 IMPLANT
GOWN STRL REUS W/TWL XL LVL3 (GOWN DISPOSABLE) ×8 IMPLANT
HEMOSTAT SPONGE AVITENE ULTRA (HEMOSTASIS) IMPLANT
IV CATH 14GX2 1/4 (CATHETERS) ×3 IMPLANT
KIT BASIN OR (CUSTOM PROCEDURE TRAY) ×3 IMPLANT
KIT POSITIONING SURG ANDREWS (MISCELLANEOUS) ×3 IMPLANT
MANIFOLD NEPTUNE II (INSTRUMENTS) ×3 IMPLANT
NDL SPNL 18GX3.5 QUINCKE PK (NEEDLE) ×2 IMPLANT
NEEDLE SPNL 18GX3.5 QUINCKE PK (NEEDLE) ×6 IMPLANT
PACK LAMINECTOMY ORTHO (CUSTOM PROCEDURE TRAY) ×3 IMPLANT
PATTIES SURGICAL .5 X.5 (GAUZE/BANDAGES/DRESSINGS) IMPLANT
PATTIES SURGICAL .75X.75 (GAUZE/BANDAGES/DRESSINGS) IMPLANT
PATTIES SURGICAL 1X1 (DISPOSABLE) IMPLANT
RUBBERBAND STERILE (MISCELLANEOUS) ×6 IMPLANT
SPONGE SURGIFOAM ABS GEL 100 (HEMOSTASIS) ×3 IMPLANT
STAPLER VISISTAT (STAPLE) IMPLANT
STRIP CLOSURE SKIN 1/2X4 (GAUZE/BANDAGES/DRESSINGS) ×2 IMPLANT
SUT NURALON 4 0 TR CR/8 (SUTURE) IMPLANT
SUT PROLENE 3 0 PS 2 (SUTURE) ×3 IMPLANT
SUT VIC AB 1 CT1 27 (SUTURE)
SUT VIC AB 1 CT1 27XBRD ANTBC (SUTURE) IMPLANT
SUT VIC AB 1-0 CT2 27 (SUTURE) ×3 IMPLANT
SUT VIC AB 2-0 CT1 27 (SUTURE)
SUT VIC AB 2-0 CT1 TAPERPNT 27 (SUTURE) IMPLANT
SUT VIC AB 2-0 CT2 27 (SUTURE) ×3 IMPLANT
SYR 3ML LL SCALE MARK (SYRINGE) IMPLANT
TOWEL OR 17X26 10 PK STRL BLUE (TOWEL DISPOSABLE) ×3 IMPLANT
TOWEL OR NON WOVEN STRL DISP B (DISPOSABLE) IMPLANT
YANKAUER SUCT BULB TIP NO VENT (SUCTIONS) IMPLANT

## 2016-07-31 NOTE — Discharge Instructions (Signed)

## 2016-07-31 NOTE — Anesthesia Postprocedure Evaluation (Signed)
Anesthesia Post Note  Patient: Clovis RileyMichael S Fulcher  Procedure(s) Performed: Procedure(s) (LRB): Revision microlumbar decompression L5-S1 left with L5-S1 foraminotomy (N/A)     Patient location during evaluation: PACU Anesthesia Type: General Level of consciousness: awake and alert Pain management: pain level controlled Vital Signs Assessment: post-procedure vital signs reviewed and stable Respiratory status: spontaneous breathing, nonlabored ventilation, respiratory function stable and patient connected to nasal cannula oxygen Cardiovascular status: blood pressure returned to baseline and stable Postop Assessment: no signs of nausea or vomiting Anesthetic complications: no    Last Vitals:  Vitals:   07/31/16 1400 07/31/16 1533  BP: (!) 142/75 135/73  Pulse: 74 70  Resp: 18 18  Temp: 37.1 C 37.1 C    Last Pain:  Vitals:   07/31/16 1533  TempSrc: Oral  PainSc:                  Jiles GarterJACKSON,Aashvi Rezabek EDWARD

## 2016-07-31 NOTE — Op Note (Signed)
NAMQuita Mckinney:  Mckinney, Nicholas                 ACCOUNT NO.:  [id number]  MEDICAL RECORD NO.:  [id number]  LOCATION:  [facility]                         [facility]  PHYSICIAN:  Jene EveryJeffrey Sylus Stgermain, M.D.    DATE OF BIRTH:  [birth year]  DATE OF PROCEDURE:  07/31/2016 DATE OF DISCHARGE:                              OPERATIVE REPORT   PREOPERATIVE DIAGNOSIS:  Recurrent disk herniation, spinal stenosis, L5- S1.  POSTOPERATIVE DIAGNOSIS:  Recurrent disk herniation, spinal stenosis, L5- S1.  PROCEDURES PERFORMED: 1. Revision microlumbar decompression, L5-S1, left. 2. Foraminotomies, L5-S1, left. 3. Microdiskectomy, redo, L5-S1, left.  ANESTHESIA:  General.  ASSISTANT:  Lanna PocheJacqueline Bissell, PA.  HISTORY:  This is a pleasant 46 year old with history of lumbar decompression at L3, recurrent left lower extremity radicular pain, refractory, was indicated for revision microlumbar decompression at L5- S1.  There is an MRI indicating disk herniation.  Risks and benefits discussed including bleeding, infection, damage to the neurovascular structures, no change in symptoms, worsening symptoms, DVT, PE, anesthetic complications, etc.  TECHNIQUE:  With the patient in supine position, after induction of adequate general anesthesia, 1 g vancomycin, placed prone on the [place] frame.  All bony prominences were well padded.  Lumbar region was prepped and draped in usual sterile fashion.  The previous surgical incision was utilized at L5-S1 and made an incision, subcutaneous tissue was dissected, electrocautery was utilized to achieve hemostasis.  A 0.25% Marcaine with epinephrine was infiltrated in the paraspinous musculature.  Divided the dorsolumbar fascia in line with skin incision. Paraspinous muscle elevated from lamina of 5-1.  Operating microscope was draped and brought on the surgical field after we digitally lysed the adhesions.  We skeletonized the previous hemilaminotomy of 5-1 with a curette.  I was  then able to with a microcurette detach the epidural fibrosis from the cephalad edge of S1.  We entered the safe space in the inferior aspect of the facet.  Following the mobilization, we identified the S1 nerve root, gently mobilized it medially.  There were facet hypertrophy and ligamentum flavum hypertrophy laterally.  I performed a generous foraminotomy of S1.  Then, decompressed the lateral recess, the medial border of the pedicle.  There was an epidural venous plexus that was cauterized and lysed to mobilize and identify a small focal disk herniation at the disk space.  Prior to that, we detached the ligamentum flavum from the caudad edge of 5.  We expanded the hemilaminotomy to detach the ligamentum flavum from its caudad edge preserving the pars. Ligamentum flavum removed from the interspace.  This was performed up to the pedicle above 5.  Then, gently mobilized the thecal sac medially, we identified the 5 root.  Stenotic at in the foramen, we therefore performed a foraminotomy of 5 protecting the 5 root.  The annulotomy was performed of the disk and disk material was removed from the disk space, two extruded fragments were excised, irrigated the disk space with catheter irrigation, antibiotic.  Additional fragment retrieved. Following this, full diskectomy was performed.  There was no residual disk herniation noted.  We had 1 cm of excursion of the S1 nerve root medial to pedicle without tension.  We checked beneath  the thecal sac, the shoulder of the root, the axilla and both foramen.  No residual nerve compression.  Following this, inspection revealed no evidence of CSF leakage or active bleeding.  We obtained a confirmatory radiograph at the disk space.  I then removed the [facility] retractor, irrigated the paraspinous musculature, no active bleeding.  Dorsolumbar fascia was closed with 1 Vicryl, subcu with 2-0, and skin with Prolene.  Sterile dressing applied, placed supine  on the hospital bed, extubated without difficulty, and transported to the recovery room in satisfactory condition.  The patient tolerated the procedure well.  No complications.  Assistant, Lanna Poche, PA was used throughout the case for patient positioning, gentle intermittent neural traction, closure.     Jene Every, M.D.     Cordelia Pen  D:  07/31/2016  T:  07/31/2016  Job:  [id number]

## 2016-07-31 NOTE — Evaluation (Signed)
Physical Therapy Evaluation Patient Details Name: Nicholas Mckinney MRN: [id number] DOB: 11/28/1970 Today's Date: 07/31/2016   History of Present Illness  redo microdiscectomy on left  Clinical Impression  Patient ambulating well. Ready for DC.    Follow Up Recommendations No PT follow up    Equipment Recommendations  None recommended by PT    Recommendations for Other Services       Precautions / Restrictions Precautions Precautions: Back Precaution Comments: reviewed handout      Mobility  Bed Mobility Overal bed mobility: Modified Independent                Transfers Overall transfer level: Needs assistance Equipment used: None Transfers: Sit to/from Stand Sit to Stand: Supervision            Ambulation/Gait Ambulation/Gait assistance: Supervision Ambulation Distance (Feet): 600 Feet Assistive device: None       General Gait Details: IV pole for part iof walk  Stairs Stairs: Yes Stairs assistance: Modified independent (Device/Increase time) Stair Management: One rail Right;Step to pattern;Forwards Number of Stairs: 2    Wheelchair Mobility    Modified Rankin (Stroke Patients Only)       Balance                                             Pertinent Vitals/Pain Pain Assessment: 0-10 Pain Score: 4  Pain Location: back Pain Descriptors / Indicators: Sore Pain Intervention(s): Premedicated before session    Home Living Family/patient expects to be discharged to:: Private residence Living Arrangements: Spouse/significant other Available Help at Discharge: Family Type of Home: House Home Access: Stairs to enter Entrance Stairs-Rails: Right Entrance Stairs-Number of Steps: 6 Home Layout: One level Home Equipment: Bedside commode      Prior Function Level of Independence: Independent               Hand Dominance        Extremity/Trunk Assessment   Upper Extremity Assessment Upper Extremity Assessment:  Overall WFL for tasks assessed    Lower Extremity Assessment Lower Extremity Assessment: Overall WFL for tasks assessed    Cervical / Trunk Assessment Cervical / Trunk Assessment: Normal  Communication   Communication: No difficulties  Cognition Arousal/Alertness: Awake/alert Behavior During Therapy: WFL for tasks assessed/performed Overall Cognitive Status: Within Functional Limits for tasks assessed                                        General Comments      Exercises     Assessment/Plan    PT Assessment Patent does not need any further PT services  PT Problem List         PT Treatment Interventions      PT Goals (Current goals can be found in the Care Plan section)  Acute Rehab PT Goals Patient Stated Goal: home PT Goal Formulation: All assessment and education complete, DC therapy    Frequency     Barriers to discharge        Co-evaluation               AM-PAC PT "6 Clicks" Daily Activity  Outcome Measure Difficulty turning over in bed (including adjusting bedclothes, sheets and blankets)?: None Difficulty moving from lying on back to sitting on  the side of the bed? : None Difficulty sitting down on and standing up from a chair with arms (e.g., wheelchair, bedside commode, etc,.)?: None Help needed moving to and from a bed to chair (including a wheelchair)?: None Help needed walking in hospital room?: A Little Help needed climbing 3-5 steps with a railing? : A Little 6 Click Score: 22    End of Session   Activity Tolerance: Patient tolerated treatment well Patient left: in chair;with call bell/phone within reach;with chair alarm set Nurse Communication: Mobility status      Time: 1610-9604 PT Time Calculation (min) (ACUTE ONLY): 29 min   Charges:   PT Evaluation $PT Eval Low Complexity: 1 Procedure PT Treatments $Gait Training: 8-22 mins   PT G Codes:   PT G-Codes **NOT FOR INPATIENT CLASS** Functional Assessment Tool  Used: Clinical judgement Functional Limitation: Mobility: Walking and moving around Mobility: Walking and Moving Around Current Status (V4098): At least 1 percent but less than 20 percent impaired, limited or restricted Mobility: Walking and Moving Around Goal Status [phone]): At least 1 percent but less than 20 percent impaired, limited or restricted Mobility: Walking and Moving Around Discharge Status [phone]): At least 1 percent but less than 20 percent impaired, limited or restricted     Nicholas Mckinney 07/31/2016, 5:11 PM
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
## 2016-07-31 NOTE — Brief Op Note (Signed)
07/31/2016  9:52 AM  PATIENT:  Nicholas Mckinney  46 y.o. male  PRE-OPERATIVE DIAGNOSIS:  Recurrent HNP L5-S1 stenosis  POST-OPERATIVE DIAGNOSIS:  Recurrent HNP L5-S1 stenosis  PROCEDURE:  Procedure(s) with comments: Revision microlumbar decompression L5-S1 left with L5-S1 foraminotomy (N/A) - 90 mins  SURGEON:  Surgeon(s) and Role:    Jene Every* Deisha Stull, MD - Primary  PHYSICIAN ASSISTANT:   ASSISTANTS: Bissell   ANESTHESIA:   general  EBL:  Total I/O In: 1000 [I.V.:1000] Out: 50 [Blood:50]  BLOOD ADMINISTERED:none  DRAINS: none   LOCAL MEDICATIONS USED:  MARCAINE     SPECIMEN:  Source of Specimen:  L5S1  DISPOSITION OF SPECIMEN:  PATHOLOGY  COUNTS:  YES  TOURNIQUET:  * No tourniquets in log *  DICTATION: .Other Dictation: Dictation Number [id number]  PLAN OF CARE: Admit for overnight observation  PATIENT DISPOSITION:  PACU - hemodynamically stable.   Delay start of Pharmacological VTE agent (>24hrs) due to surgical blood loss or risk of bleeding: yes

## 2016-07-31 NOTE — Discharge Summary (Signed)
Physician Discharge Summary   Patient ID: Nicholas Mckinney MRN: [id number] DOB/AGE: 47-Jul-1972 47 y.o.  Admit date: 07/31/2016 Discharge date: 07/31/2016  Primary Diagnosis:   Recurrent HNP L5-S1 stenosis  Admission Diagnoses:  Past Medical History:  Diagnosis Date  . Allergic rhinitis   . Anxiety and depression   . Chronic low back pain   . Diverticulitis   . Elevated blood pressure reading without diagnosis of hypertension   . GERD (gastroesophageal reflux disease)   . History of myocarditis 1998   . Hypothyroidism   . Lateral epicondylitis of both elbows 2014   with tendon tears bilat (Ortho= Dr. Meriel Flavors at [place])  . Obstructive sleep apnea 11/27/2015  . OSA (obstructive sleep apnea)    with PLMS; CPAP titration done 01/2016  . Osteoarthritis of lumbar spine    hx of ruptured lumbar disc  . Pneumonia   . REM sleep behavior disorder    REM dependent parasomnia  . Shift work sleep disorder    Discharge Diagnoses:   Principal Problem:   HNP (herniated nucleus pulposus), lumbar  Procedure:  Procedure(s) (LRB): Revision microlumbar decompression L5-S1 left with L5-S1 foraminotomy (N/A)   Consults: None  HPI:  see H&P    Laboratory Data: Hospital Outpatient Visit on 07/26/2016  Component Date Value Ref Range Status  . Sodium 07/26/2016 139  135 - 145 mmol/L Final  . Potassium 07/26/2016 4.7  3.5 - 5.1 mmol/L Final  . Chloride 07/26/2016 101  101 - 111 mmol/L Final  . CO2 07/26/2016 28  22 - 32 mmol/L Final  . Glucose, Bld 07/26/2016 94  65 - 99 mg/dL Final  . BUN 07/26/2016 17  6 - 20 mg/dL Final  . Creatinine, Ser 07/26/2016 1.02  0.61 - 1.24 mg/dL Final  . Calcium 07/26/2016 9.9  8.9 - 10.3 mg/dL Final  . GFR calc non Af Amer 07/26/2016 >60  >60 mL/min Final  . GFR calc Af Amer 07/26/2016 >60  >60 mL/min Final   Comment: (NOTE) The eGFR has been calculated using the CKD EPI equation. This calculation has not been validated in all clinical  situations. eGFR's persistently <60 mL/min signify possible Chronic Kidney Disease.   . Anion gap 07/26/2016 10  5 - 15 Final  . WBC 07/26/2016 14.5* 4.0 - 10.5 K/uL Final  . RBC 07/26/2016 4.71  4.22 - 5.81 MIL/uL Final  . Hemoglobin 07/26/2016 14.6  13.0 - 17.0 g/dL Final  . HCT 07/26/2016 41.6  39.0 - 52.0 % Final  . MCV 07/26/2016 88.3  78.0 - 100.0 fL Final  . MCH 07/26/2016 31.0  26.0 - 34.0 pg Final  . MCHC 07/26/2016 35.1  30.0 - 36.0 g/dL Final  . RDW 07/26/2016 13.1  11.5 - 15.5 % Final  . Platelets 07/26/2016 395  150 - 400 K/uL Final  . MRSA, PCR 07/26/2016 NEGATIVE  NEGATIVE Final  . Staphylococcus aureus 07/26/2016 POSITIVE* NEGATIVE Final   Comment:        The Xpert SA Assay (FDA approved for NASAL specimens in patients over 20 years of age), is one component of a comprehensive surveillance program.  Test performance has been validated by [facility] for patients greater than or equal to 64 year old. It is not intended to diagnose infection nor to guide or monitor treatment.    No results for input(s): HGB in the last 72 hours. No results for input(s): WBC, RBC, HCT, PLT in the last 72 hours. No results for input(s): NA, K, CL,  CO2, BUN, CREATININE, GLUCOSE, CALCIUM in the last 72 hours. No results for input(s): LABPT, INR in the last 72 hours.  X-Rays:Dg Lumbar Spine 2-3 Views  Addendum Date: 07/30/2016   ADDENDUM REPORT: 07/30/2016 10:15 ADDENDUM: Normal lumbar segmentation, as demonstrated on the 05/23/2015 CT Abdomen and Pelvis. The lumbar levels on these images were numbered in anticipation of surgery. Electronically Signed   By: Genevie Ann M.D.   On: 07/30/2016 10:15   Result Date: 07/30/2016 CLINICAL DATA:  Lumbar laminectomy, decompression. EXAM: LUMBAR SPINE - 2-3 VIEW COMPARISON:  None. FINDINGS: Early disc space narrowing at L5-S1. Mild degenerative facet disease at L5-S1. Normal alignment. No fracture. SI joints are symmetric and unremarkable.  IMPRESSION: Early degenerative disc and facet disease at the lumbosacral junction. No acute findings. Electronically Signed: By: Rolm Baptise M.D. On: 07/26/2016 10:09   Dg Spine Portable 1 View  Result Date: 07/31/2016 CLINICAL DATA:  Intraoperative localization film in patient undergoing L5-S1 decompression. EXAM: PORTABLE SPINE - 1 VIEW COMPARISON:  Intraoperative localization film earlier today. FINDINGS: Single probe is in place and projects the L5-S1 disc interspace. IMPRESSION: As above. Electronically Signed   By: Inge Rise M.D.   On: 07/31/2016 10:09   Dg Spine Portable 1 View  Result Date: 07/31/2016 CLINICAL DATA:  Elective surgery.  L5-S1 surgical level EXAM: PORTABLE SPINE - 1 VIEW COMPARISON:  Abdominal CT 05/23/2015 FINDINGS: Based on abdominal CT from 2017 there are small ribs at T12. This gives 5 lumbar type vertebral bodies. A retractor and probe is present over the L5-S1 interspinous space. IMPRESSION: Retractor overlaps the L5-S1 interspinous space. Electronically Signed   By: Monte Fantasia M.D.   On: 07/31/2016 09:16    EKG: Orders placed or performed during the hospital encounter of 06/08/15  . ED EKG  . ED EKG  . EKG 12-Lead  . EKG 12-Lead  . EKG     Hospital Course: Patient was admitted to [facility] and taken to the OR and underwent the above state procedure without complications.  Patient tolerated the procedure well and was later transferred to the recovery room and then to the orthopaedic floor for postoperative care.  They were given PO and IV analgesics for pain control following their surgery.  They were given postoperative antibiotics.   PT was consulted postop to assist with mobility and transfers.  The patient was allowed to be WBAT with therapy and was taught back precautions. Discharge planning was consulted to help with postop disposition and equipment needs.  Patient started to get up OOB with therapy on the day of surgery. They were given  discharge instructions and dressing directions.  They were instructed on when to follow up in the office with Dr. Tonita Cong.   Diet: Regular diet Activity:WBAT; Lspine precautions Follow-up:in 10-14 days Disposition - Home Discharged Condition: good   Discharge Instructions    Call MD / Call 911    Complete by:  As directed    If you experience chest pain or shortness of breath, CALL 911 and be transported to the hospital emergency room.  If you develope a fever above 101 F, pus (white drainage) or increased drainage or redness at the wound, or calf pain, call your surgeon's office.   Constipation Prevention    Complete by:  As directed    Drink plenty of fluids.  Prune juice may be helpful.  You may use a stool softener, such as Colace (over the counter) 100 mg twice a day.  Use MiraLax (over the counter) for constipation as needed.   Diet - low sodium heart healthy    Complete by:  As directed    Increase activity slowly as tolerated    Complete by:  As directed      Allergies as of 07/31/2016      Reactions   Duloxetine Other (See Comments)   Abdominal pain   Penicillins Hives   All cillin..Has patient had a PCN reaction causing immediate rash, facial/tongue/throat swelling, SOB or lightheadedness with hypotension: No Has patient had a PCN reaction causing severe rash involving mucus membranes or skin necrosis: NO Has patient had a PCN reaction that required hospitalization No Has patient had a PCN reaction occurring within the last 10 years: No If all of the above answers are "NO", then may proceed with Cephalosporin use.      Medication List    TAKE these medications   aspirin 81 MG chewable tablet Chew 1 tablet (81 mg total) by mouth daily. May resume 5 days post-op What changed:  additional instructions   docusate sodium 100 MG capsule Commonly known as:  COLACE Take 1 capsule (100 mg total) by mouth 2 (two) times daily as needed for mild constipation.    HYDROcodone-acetaminophen 10-325 MG tablet Commonly known as:  NORCO Take 1-2 tablets by mouth every 4 (four) hours as needed for severe pain.   levothyroxine 75 MCG tablet Commonly known as:  SYNTHROID, LEVOTHROID TAKE 1 TABLET (75 MCG TOTAL) BY MOUTH DAILY BEFORE BREAKFAST.   methocarbamol 500 MG tablet Commonly known as:  ROBAXIN Take 1 tablet (500 mg total) by mouth every 6 (six) hours as needed for muscle spasms.   pantoprazole 40 MG tablet Commonly known as:  PROTONIX Take 1 tablet (40 mg total) by mouth daily.   PARoxetine 20 MG tablet Commonly known as:  PAXIL TAKE 1&1/2 TABLETS BY MOUTH EVERY MORNING   polyethylene glycol packet Commonly known as:  MIRALAX / GLYCOLAX Take 17 g by mouth daily.      Follow-up Information    Susa Day, MD Follow up in 2 week(s).   Specialty:  Orthopedic Surgery Contact information: [address] [place] [address] [phone]           Signed: Lacie Draft, PA-C Orthopaedic Surgery 07/31/2016, 3:34 PM

## 2016-07-31 NOTE — Anesthesia Procedure Notes (Signed)
Procedure Name: Intubation Date/Time: 07/31/2016 8:41 AM Performed by: Leroy LibmanEARDON, Maretta Overdorf L Patient Re-evaluated:Patient Re-evaluated prior to induction Oxygen Delivery Method: Circle system utilized Preoxygenation: Pre-oxygenation with 100% oxygen Induction Type: IV induction Ventilation: Mask ventilation without difficulty and Oral airway inserted - appropriate to patient size Laryngoscope Size: Miller and 3 Grade View: Grade I Tube type: Oral Tube size: 8.0 mm Number of attempts: 1 Airway Equipment and Method: Stylet Placement Confirmation: ETT inserted through vocal cords under direct vision,  positive ETCO2 and breath sounds checked- equal and bilateral Secured at: 22 cm Tube secured with: Tape Dental Injury: Teeth and Oropharynx as per pre-operative assessment

## 2016-07-31 NOTE — Progress Notes (Signed)
Brief Pharmacy Note re: IV Vancomycin  Consult received for IV Vancomycin x1 dose 12hrs post-op.   No drain placed per surgical note.  Wt=103kg; Estimated CrCl >6980ml/min pre-op dose received at  0739.  : Vancomycin 1500mg  IV x1 dose at 6pm  Junita PushMichelle Kaiyan Luczak, PharmD, BCPS Pager: [phone] 07/31/2016@12 :27 PM

## 2016-07-31 NOTE — Interval H&P Note (Signed)
History and Physical Interval Note:  07/31/2016 8:30 AM  Nicholas Mckinney  has presented today for surgery, with the diagnosis of Recurrent HNP L5-S1 stenosis  The various methods of treatment have been discussed with the patient and family. After consideration of risks, benefits and other options for treatment, the patient has consented to  Procedure(s) with comments: Revision microlumbar decompression L5-S1 left with L5-S1 foraminotomy (N/A) - 90 mins as a surgical intervention .  The patient's history has been reviewed, patient examined, no change in status, stable for surgery.  I have reviewed the patient's chart and labs.  Questions were answered to the patient's satisfaction.     Ayumi Wangerin C

## 2016-07-31 NOTE — Transfer of Care (Signed)
Immediate Anesthesia Transfer of Care Note  Patient: Nicholas Mckinney  Procedure(s) Performed: Procedure(s) with comments: Revision microlumbar decompression L5-S1 left with L5-S1 foraminotomy (N/A) - 90 mins  Patient Location: PACU  Anesthesia Type:General  Level of Consciousness: awake and alert   Airway & Oxygen Therapy: Patient Spontanous Breathing and Patient connected to face mask oxygen  Post-op Assessment: Report given to RN and Post -op Vital signs reviewed and stable  Post vital signs: Reviewed and stable  Last Vitals:  Vitals:   07/31/16 0625  BP: (!) 139/93  Pulse: 66  Resp: 16  Temp: 36.9 C    Last Pain:  Vitals:   07/31/16 0642  TempSrc:   PainSc: 5       Patients Stated Pain Goal: 4 (07/31/16 16100642)  Complications: No apparent anesthesia complications

## 2016-07-31 NOTE — Progress Notes (Signed)
OT Cancellation Note  Patient Details Name: Nicholas Mckinney MRN: [id number] DOB: 01/15/1970   Cancelled Treatment:    Reason Eval/Treat Not Completed: OT screened, no needs identified, will sign off. Pt observed to be ambulating in hallway with PT; reports no difficulties with LB ADL and able to recall 3/3 back precautions. No acute OT needs identified; will screen and sign off at this time. Please re-consult if needs change. Thank you for this referral.  Gaye AlkenBailey A Keiyon Plack M.S., OTR/L Pager: [phone]  07/31/2016, 2:50 PM

## 2016-08-01 DIAGNOSIS — G4733 Obstructive sleep apnea (adult) (pediatric): Secondary | ICD-10-CM | POA: Diagnosis not present

## 2016-08-02 ENCOUNTER — Encounter: Payer: Self-pay | Admitting: Family Medicine

## 2016-08-04 DIAGNOSIS — G4733 Obstructive sleep apnea (adult) (pediatric): Secondary | ICD-10-CM | POA: Diagnosis not present

## 2016-08-20 DIAGNOSIS — M545 Low back pain: Secondary | ICD-10-CM | POA: Diagnosis not present

## 2016-08-21 ENCOUNTER — Other Ambulatory Visit: Payer: Self-pay | Admitting: Family Medicine

## 2016-08-21 NOTE — Telephone Encounter (Signed)
Pt advised and voiced understanding. Apt made for 09/09/16 at 9:45am

## 2016-08-21 NOTE — Telephone Encounter (Signed)
Left message for pt to call back  °

## 2016-08-21 NOTE — Telephone Encounter (Signed)
[facility]  RF request for levothyroxine LOV: 08/28/15 Next ov: None Last written: 05/22/16 #90 w/ 0RF  08/28/15 TSH 1.06  Will send Rx for #30 w/ 0RF. Pt needs CPE or f/u [facility] for more refills.

## 2016-08-27 DIAGNOSIS — M545 Low back pain: Secondary | ICD-10-CM | POA: Diagnosis not present

## 2016-08-30 DIAGNOSIS — M545 Low back pain: Secondary | ICD-10-CM | POA: Diagnosis not present

## 2016-09-03 DIAGNOSIS — M545 Low back pain: Secondary | ICD-10-CM | POA: Diagnosis not present

## 2016-09-04 DIAGNOSIS — G4733 Obstructive sleep apnea (adult) (pediatric): Secondary | ICD-10-CM | POA: Diagnosis not present

## 2016-09-06 DIAGNOSIS — M545 Low back pain: Secondary | ICD-10-CM | POA: Diagnosis not present

## 2016-09-07 IMAGING — DX DG ABDOMEN 2V
3 series · 3 of 3 positions shown · non-contrast
Comparison: None in PACs

CLINICAL DATA: Abdominal pain and constipation for the past 2 days

EXAM:
ABDOMEN - 2 VIEW

[abdomen erect]
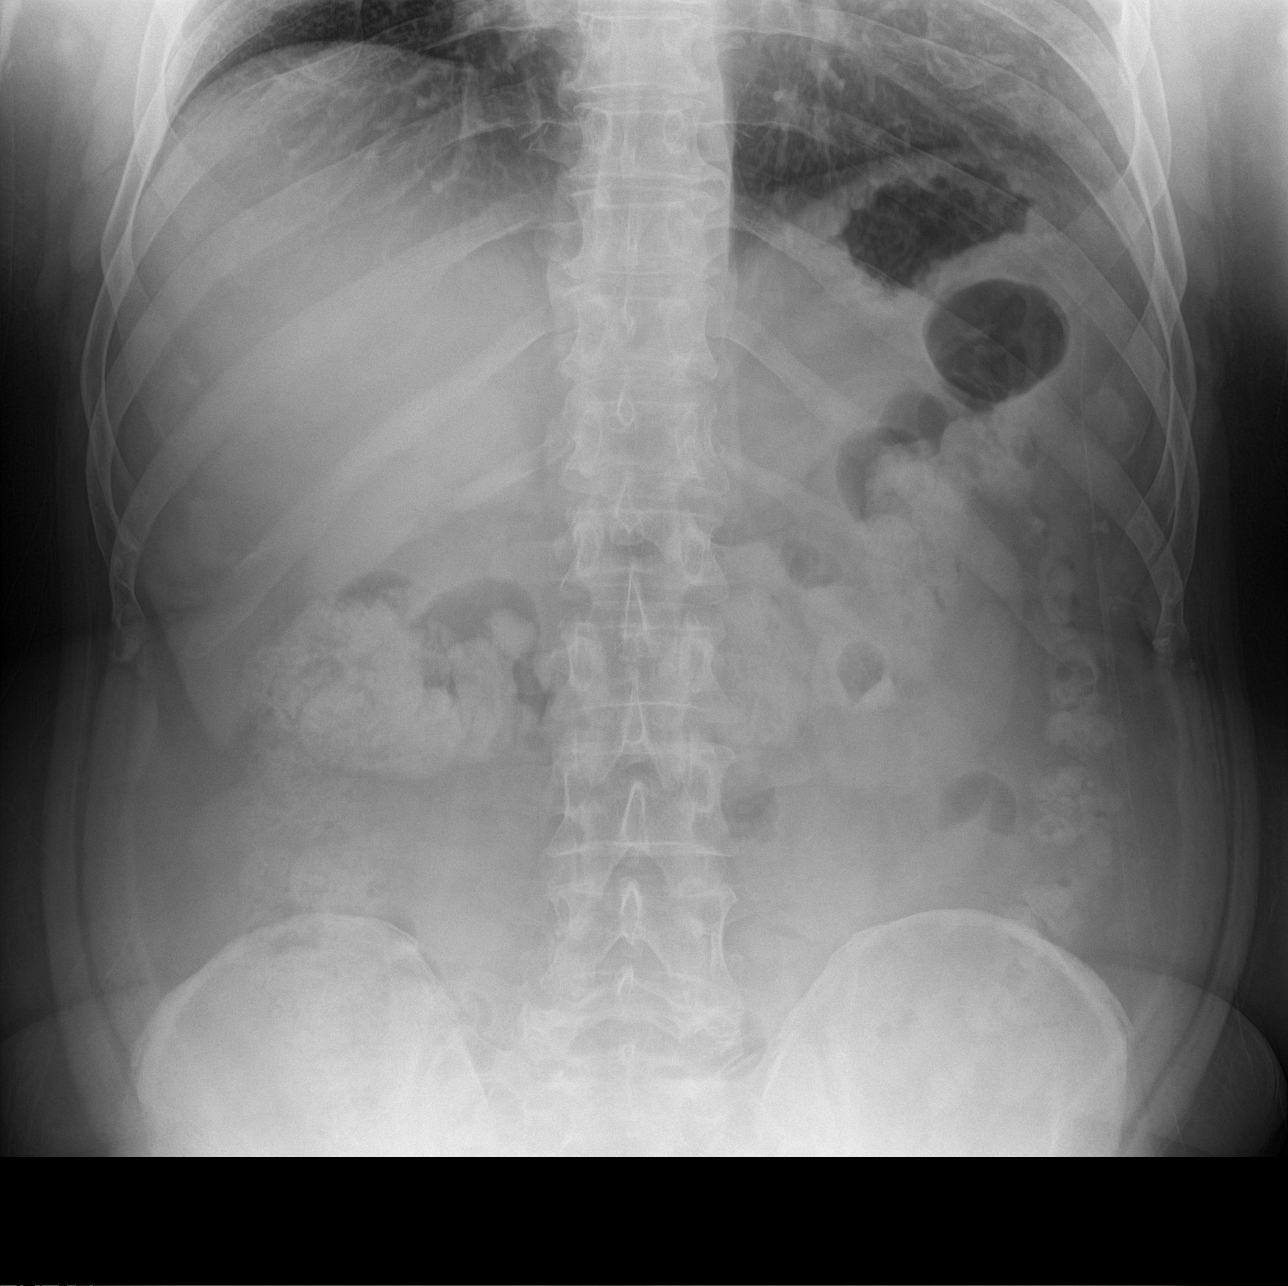

[abdomen supine (1 of 2)]
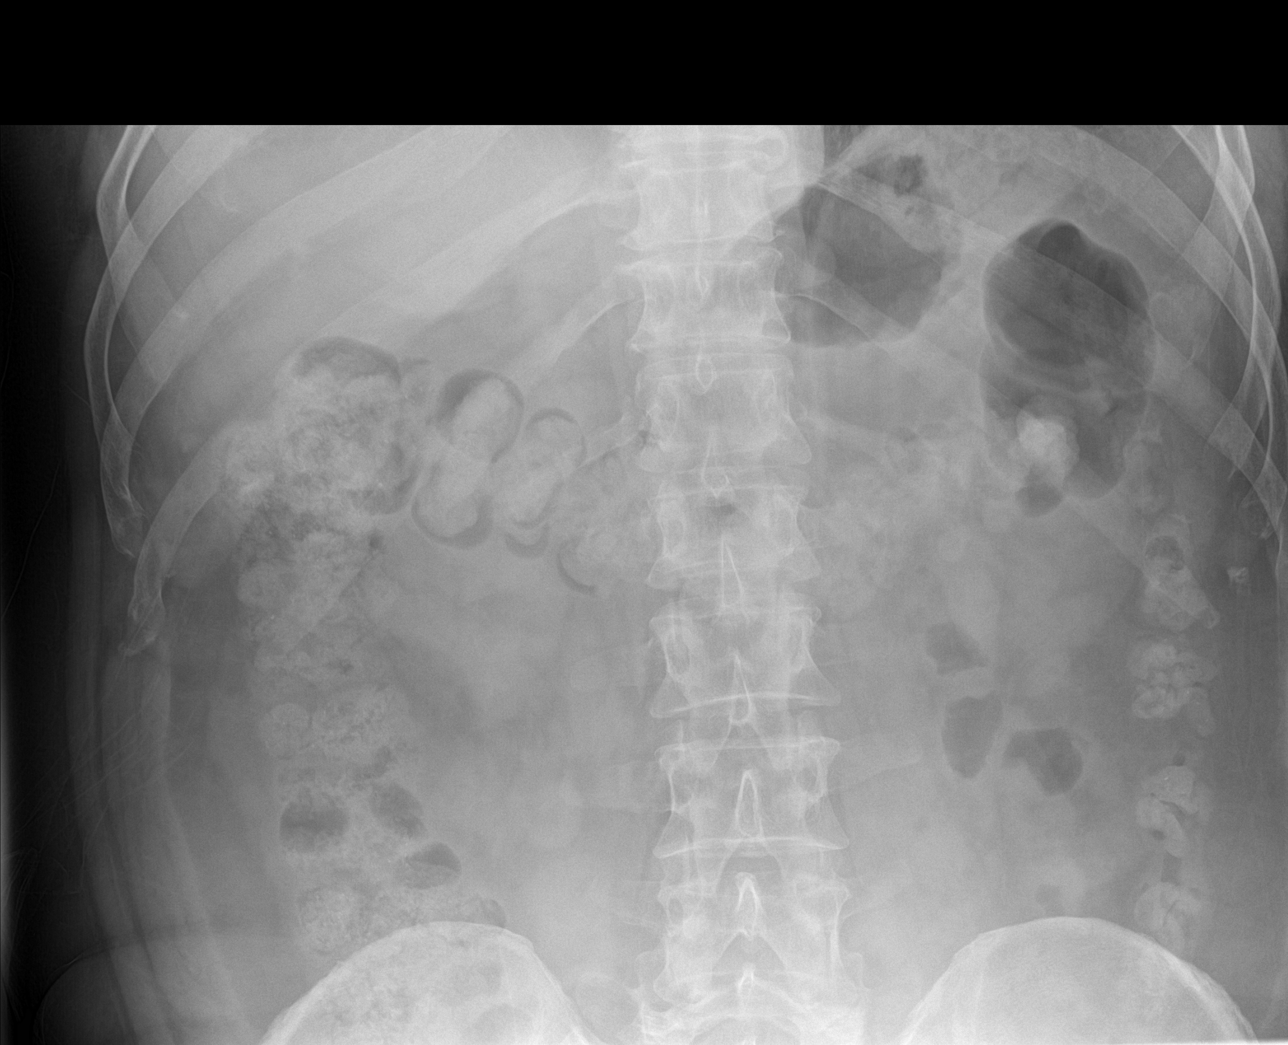

[abdomen supine (2 of 2)]
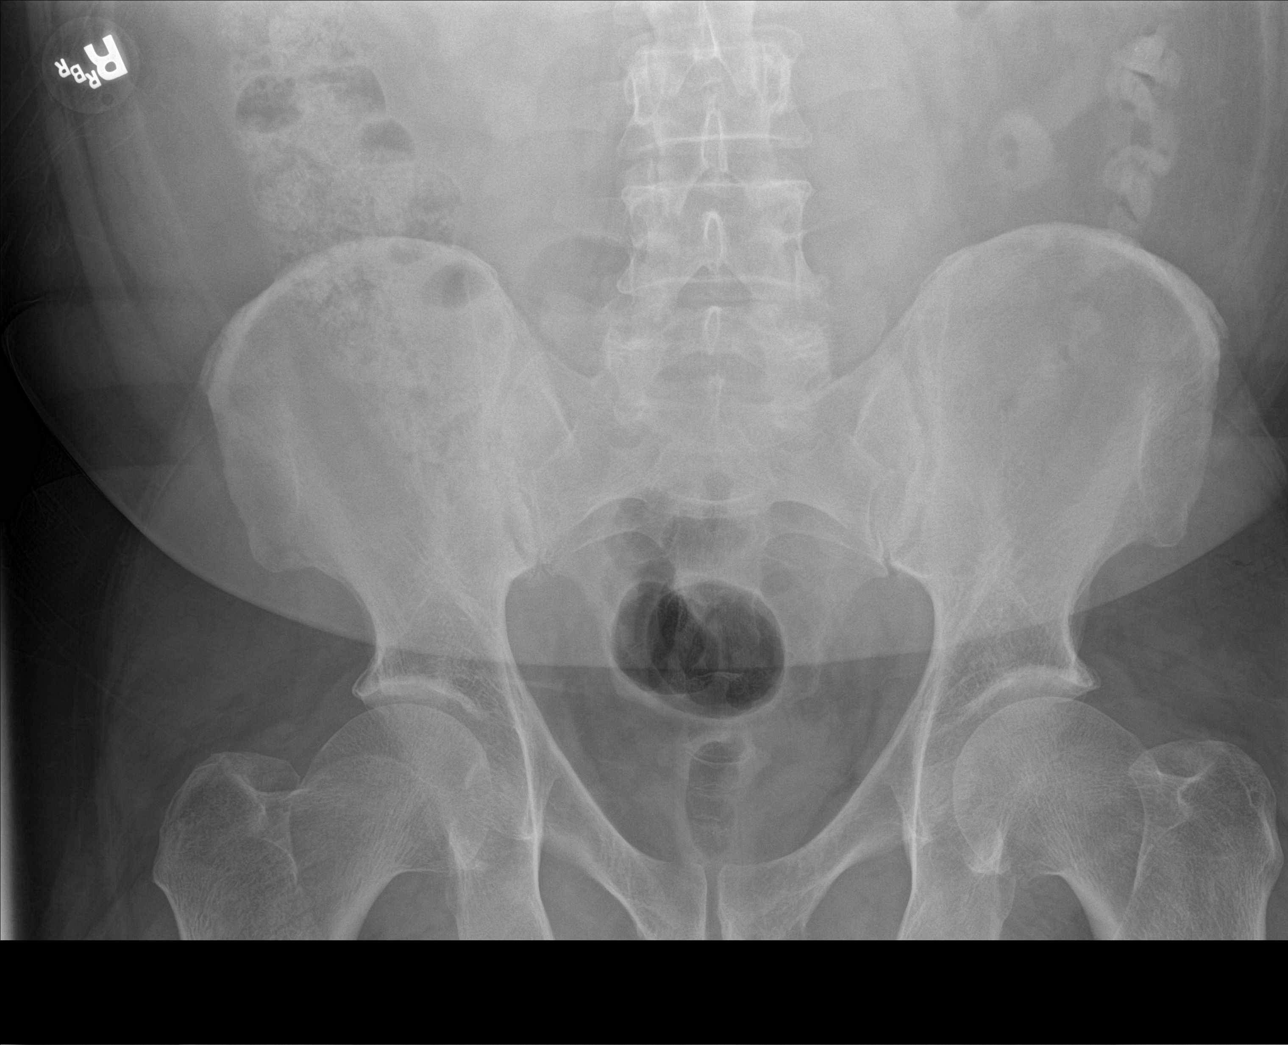

[3 of 3 positions shown; findings below may reference images not displayed]

FINDINGS: There is a moderate stool burden within the colon. There is no small
or large bowel obstructive pattern. There is a small amount of
rectal gas. The soft tissues exhibit no abnormal calcifications. The
bony structures are unremarkable.
IMPRESSION: Moderately increased colonic stool burden may reflect constipation
in the appropriate clinical setting. There is no evidence of bowel
obstruction or perforation.

## 2016-09-09 ENCOUNTER — Ambulatory Visit (INDEPENDENT_AMBULATORY_CARE_PROVIDER_SITE_OTHER): Payer: 59 | Admitting: Family Medicine

## 2016-09-09 ENCOUNTER — Encounter: Payer: Self-pay | Admitting: Family Medicine

## 2016-09-09 ENCOUNTER — Other Ambulatory Visit: Payer: Self-pay | Admitting: Family Medicine

## 2016-09-09 VITALS — BP 129/80 | HR 70 | Temp 98.3°F | Resp 16 | Ht 71.0 in | Wt 233.5 lb

## 2016-09-09 DIAGNOSIS — E039 Hypothyroidism, unspecified: Secondary | ICD-10-CM

## 2016-09-09 DIAGNOSIS — D72828 Other elevated white blood cell count: Secondary | ICD-10-CM

## 2016-09-09 DIAGNOSIS — Z9081 Acquired absence of spleen: Secondary | ICD-10-CM | POA: Diagnosis not present

## 2016-09-09 DIAGNOSIS — Z0000 Encounter for general adult medical examination without abnormal findings: Secondary | ICD-10-CM

## 2016-09-09 LAB — CBC WITH DIFFERENTIAL/PLATELET
BASOS PCT: 0.5 % (ref 0.0–3.0)
Basophils Absolute: 0.1 10*3/uL (ref 0.0–0.1)
EOS PCT: 5.4 % — AB (ref 0.0–5.0)
Eosinophils Absolute: 0.6 10*3/uL (ref 0.0–0.7)
HCT: 44.1 % (ref 39.0–52.0)
Hemoglobin: 14.7 g/dL (ref 13.0–17.0)
LYMPHS ABS: 3.4 10*3/uL (ref 0.7–4.0)
Lymphocytes Relative: 30.7 % (ref 12.0–46.0)
MCHC: 33.5 g/dL (ref 30.0–36.0)
MCV: 94 fl (ref 78.0–100.0)
MONO ABS: 1.1 10*3/uL — AB (ref 0.1–1.0)
MONOS PCT: 9.9 % (ref 3.0–12.0)
NEUTROS PCT: 53.5 % (ref 43.0–77.0)
Neutro Abs: 6 10*3/uL (ref 1.4–7.7)
Platelets: 378 10*3/uL (ref 150.0–400.0)
RBC: 4.69 Mil/uL (ref 4.22–5.81)
RDW: 13.7 % (ref 11.5–15.5)
WBC: 11.2 10*3/uL — ABNORMAL HIGH (ref 4.0–10.5)

## 2016-09-09 LAB — LIPID PANEL
Cholesterol: 210 mg/dL — ABNORMAL HIGH (ref 0–200)
HDL: 44.1 mg/dL (ref >39.00)
NONHDL: 166.01
TRIGLYCERIDES: 322 mg/dL — AB (ref 0.0–149.0)
Total CHOL/HDL Ratio: 5
VLDL: 64.4 mg/dL — ABNORMAL HIGH (ref 0.0–40.0)

## 2016-09-09 LAB — TSH: TSH: 4.74 u[IU]/mL — ABNORMAL HIGH (ref 0.35–4.50)

## 2016-09-09 LAB — HEPATIC FUNCTION PANEL
ALBUMIN: 4.3 g/dL (ref 3.5–5.2)
ALK PHOS: 73 U/L (ref 39–117)
ALT: 23 U/L (ref 0–53)
AST: 19 U/L (ref 0–37)
BILIRUBIN DIRECT: 0.1 mg/dL (ref 0.0–0.3)
Total Bilirubin: 0.6 mg/dL (ref 0.2–1.2)
Total Protein: 7.5 g/dL (ref 6.0–8.3)

## 2016-09-09 LAB — LDL CHOLESTEROL, DIRECT: Direct LDL: 122 mg/dL

## 2016-09-09 NOTE — Telephone Encounter (Signed)
Medication not on med list. Need to know if pt is using this nasal spray. Left message for pt to call back.
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
## 2016-09-09 NOTE — Patient Instructions (Signed)

## 2016-09-10 ENCOUNTER — Encounter: Payer: Self-pay | Admitting: *Deleted

## 2016-09-10 ENCOUNTER — Other Ambulatory Visit: Payer: Self-pay | Admitting: *Deleted

## 2016-09-10 DIAGNOSIS — E039 Hypothyroidism, unspecified: Secondary | ICD-10-CM

## 2016-09-10 MED ORDER — LEVOTHYROXINE SODIUM 88 MCG PO TABS: 88.0000 ug | ORAL_TABLET | Freq: Every day | ORAL | 1 refills | Status: DC

## 2016-09-10 NOTE — Telephone Encounter (Signed)
SW pt, he is no longer using this medication.

## 2016-09-13 ENCOUNTER — Other Ambulatory Visit: Payer: Self-pay | Admitting: Family Medicine

## 2016-09-13 DIAGNOSIS — M545 Low back pain: Secondary | ICD-10-CM | POA: Diagnosis not present

## 2016-09-18 DIAGNOSIS — M545 Low back pain: Secondary | ICD-10-CM | POA: Diagnosis not present

## 2016-09-24 ENCOUNTER — Other Ambulatory Visit: Payer: Self-pay | Admitting: Family Medicine

## 2016-10-01 ENCOUNTER — Other Ambulatory Visit: Payer: Self-pay | Admitting: Family Medicine

## 2016-10-01 DIAGNOSIS — M545 Low back pain: Secondary | ICD-10-CM | POA: Diagnosis not present

## 2016-10-05 DIAGNOSIS — G4733 Obstructive sleep apnea (adult) (pediatric): Secondary | ICD-10-CM | POA: Diagnosis not present

## 2016-10-08 DIAGNOSIS — M545 Low back pain: Secondary | ICD-10-CM | POA: Diagnosis not present

## 2016-11-04 DIAGNOSIS — G4733 Obstructive sleep apnea (adult) (pediatric): Secondary | ICD-10-CM | POA: Diagnosis not present

## 2016-11-05 ENCOUNTER — Other Ambulatory Visit: Payer: Self-pay | Admitting: Family Medicine

## 2016-11-05 NOTE — Telephone Encounter (Signed)
Pt is due to have lab recheck for TSH. Will send Rx for #30 w/ 0RF. Needs lab visit for more refills.   Left message for pt to call back.

## 2016-11-05 NOTE — Telephone Encounter (Signed)
Pt advised and voiced understanding.  Lab apt made for 11/08/16 at 8:00am.

## 2016-11-08 ENCOUNTER — Other Ambulatory Visit: Payer: 59

## 2016-11-15 ENCOUNTER — Other Ambulatory Visit (INDEPENDENT_AMBULATORY_CARE_PROVIDER_SITE_OTHER): Payer: 59

## 2016-11-15 DIAGNOSIS — E039 Hypothyroidism, unspecified: Secondary | ICD-10-CM

## 2016-11-15 LAB — TSH: TSH: 0.8 u[IU]/mL (ref 0.35–4.50)

## 2016-11-17 ENCOUNTER — Other Ambulatory Visit: Payer: Self-pay | Admitting: Family Medicine

## 2016-11-17 MED ORDER — LEVOTHYROXINE SODIUM 88 MCG PO TABS: ORAL_TABLET | ORAL | 3 refills | Status: DC

## 2016-12-05 DIAGNOSIS — G4733 Obstructive sleep apnea (adult) (pediatric): Secondary | ICD-10-CM | POA: Diagnosis not present

## 2016-12-16 IMAGING — CR DG CHEST 2V
2 series · 2 of 2 positions shown · non-contrast
Comparison: 01/11/2009.

CLINICAL DATA: Altered mental status.  Dizziness.

EXAM:
CHEST  2 VIEW

[w chest pa]
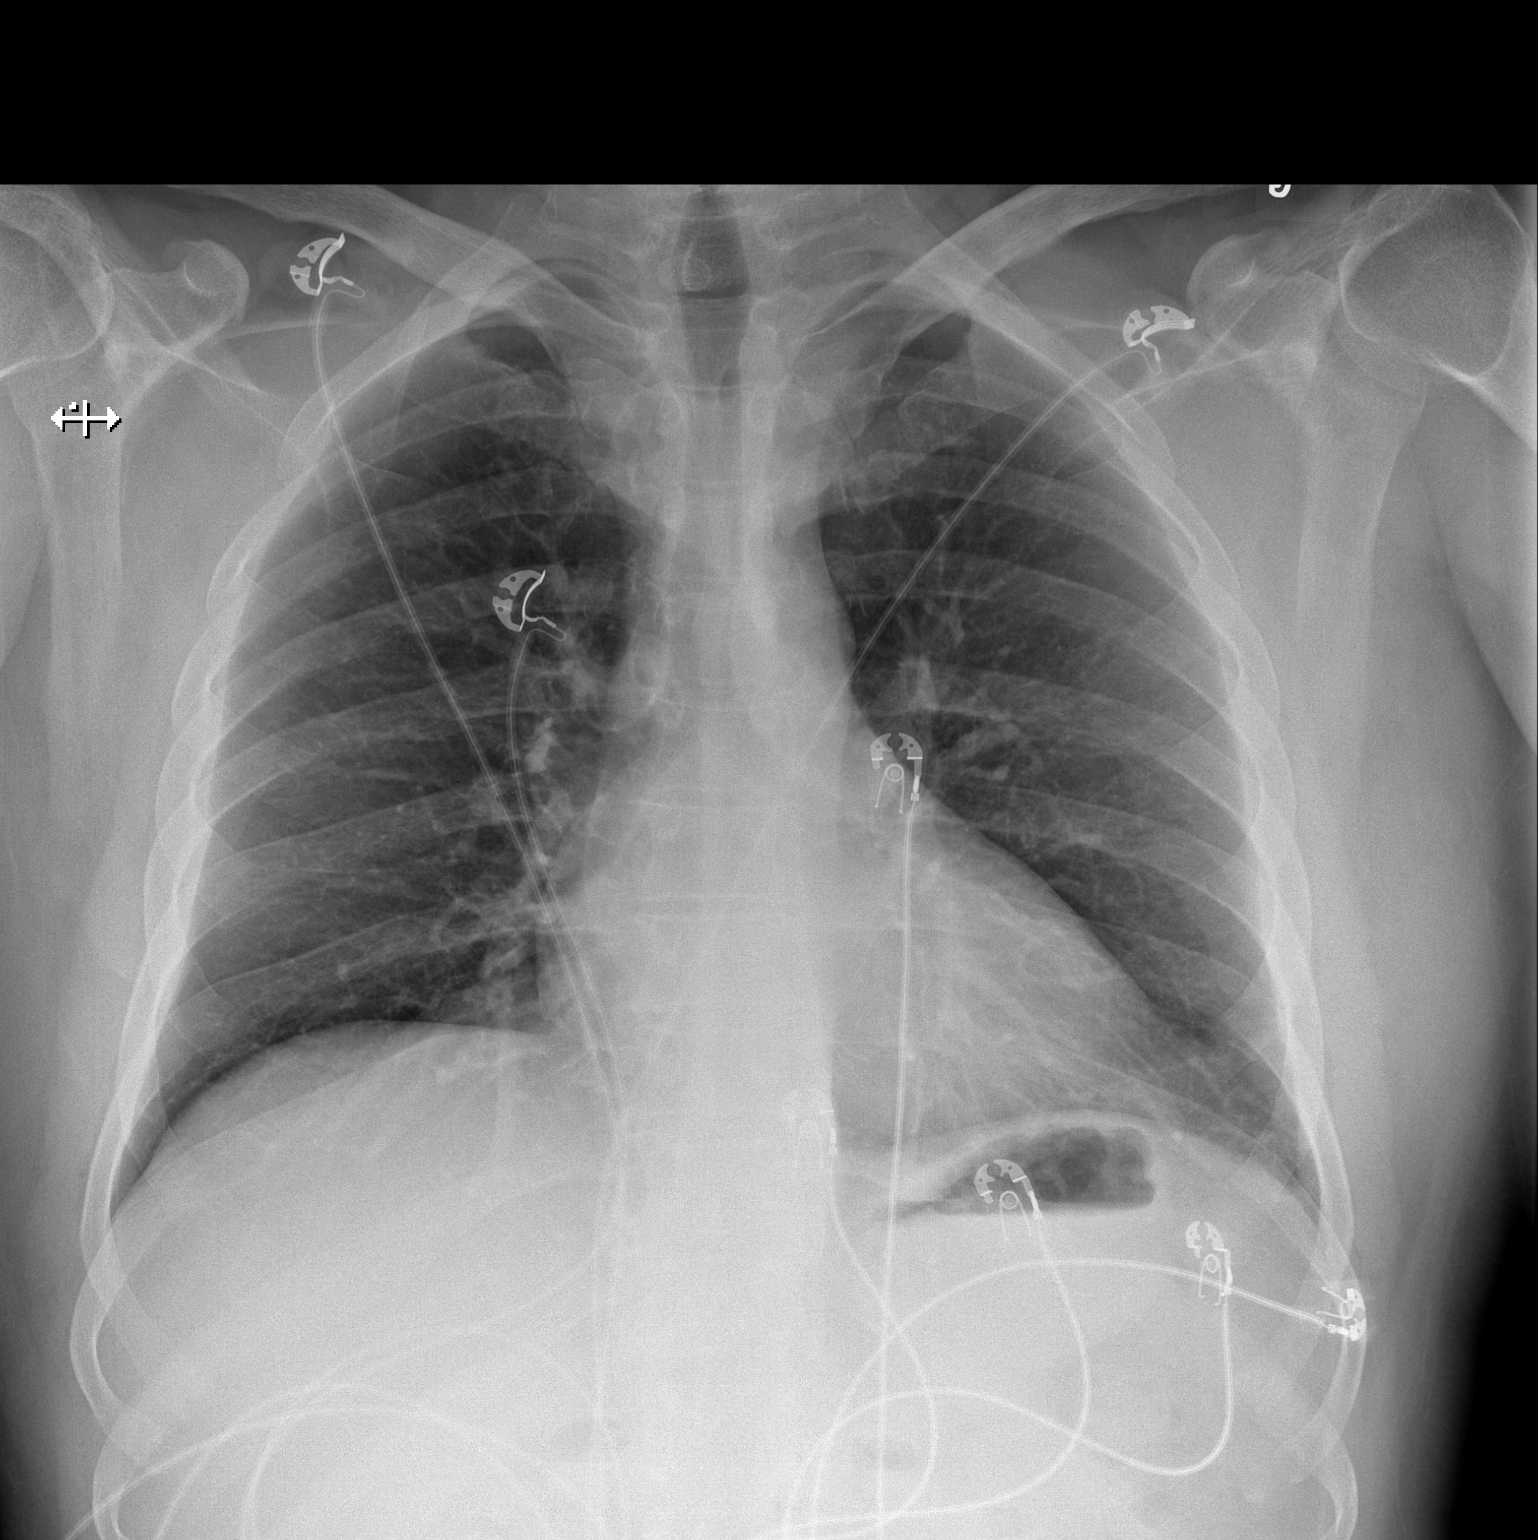

[w chest lat]
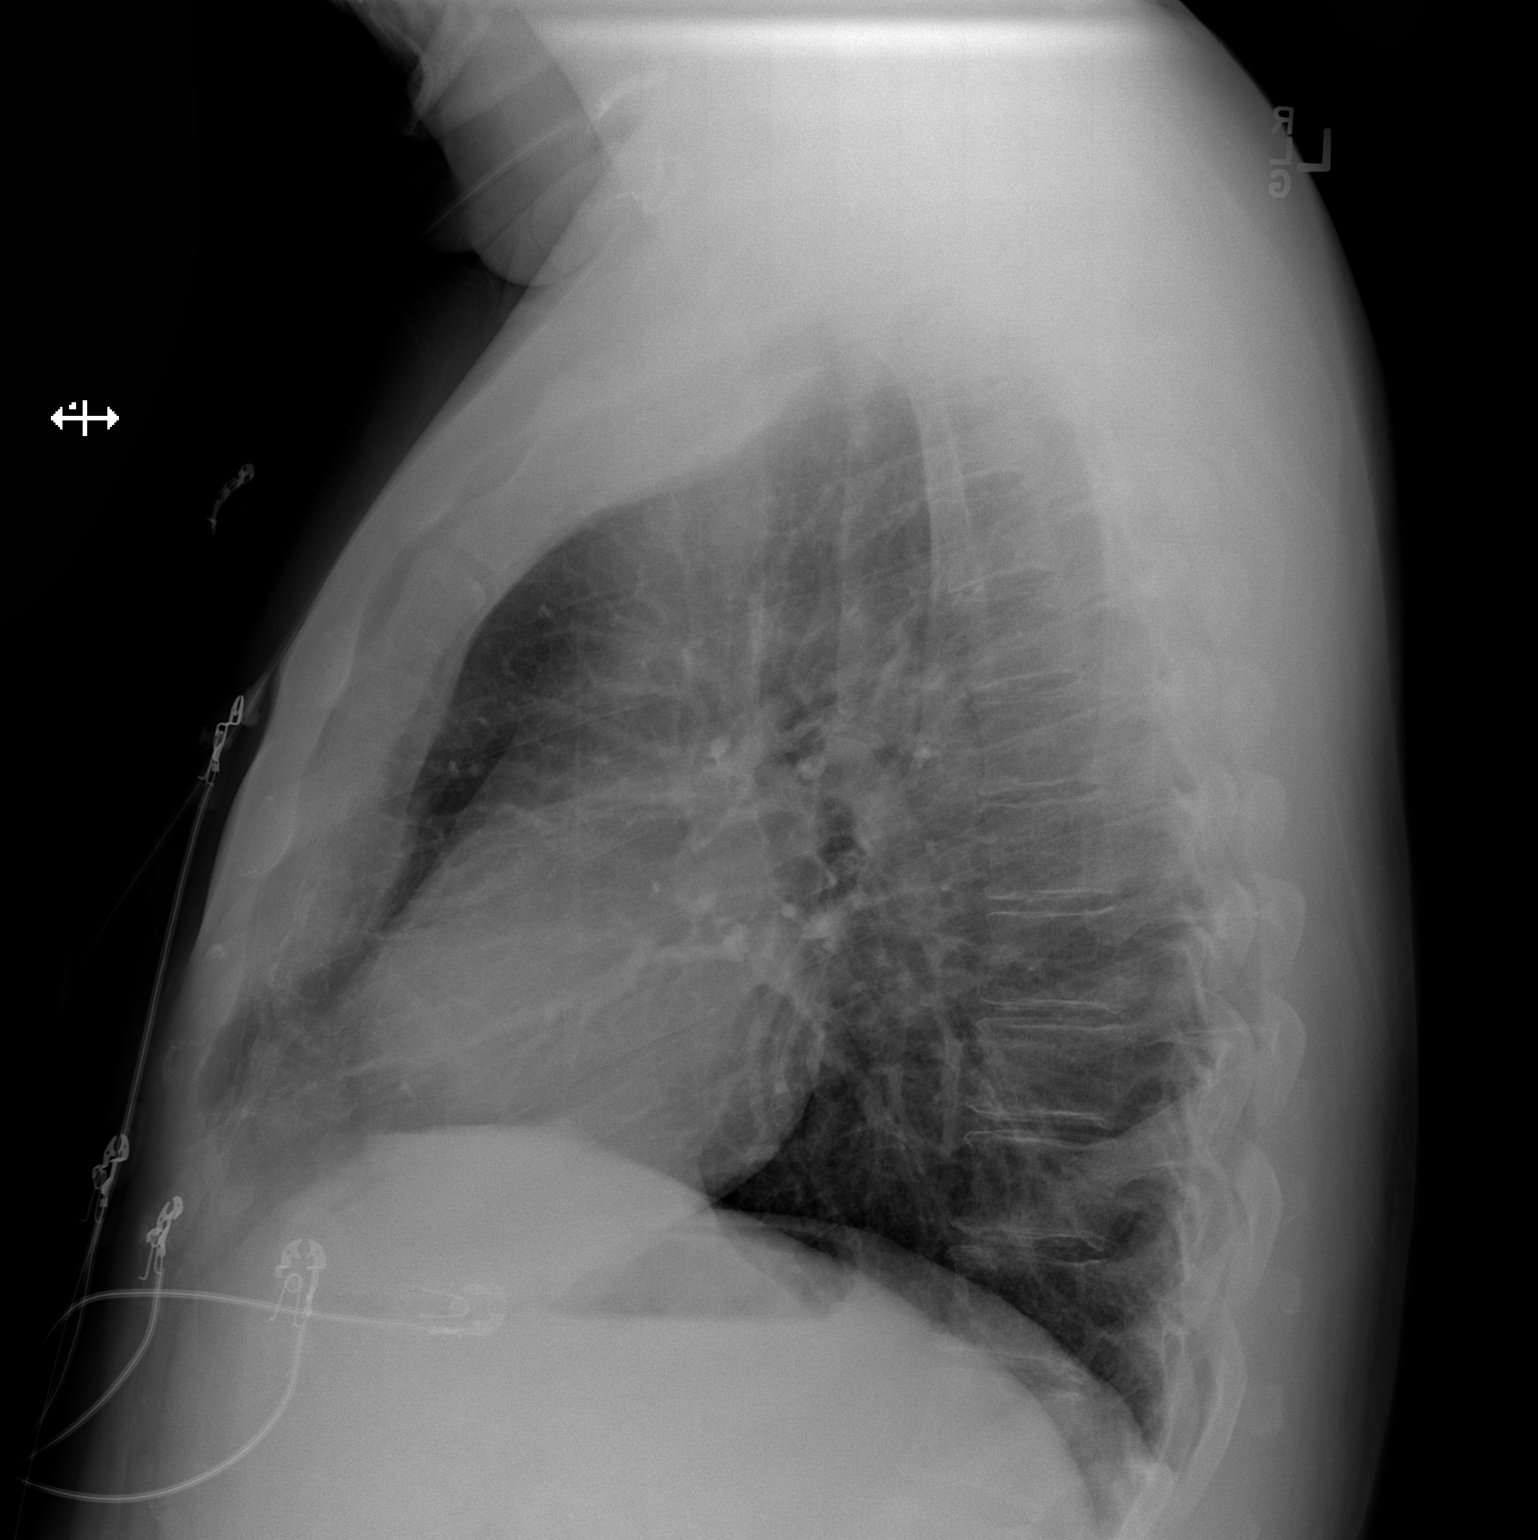

[2 of 2 positions shown; findings below may reference images not displayed]

FINDINGS: Normal sized heart. Clear lungs with normal vascularity. Mild
bilateral AC joint degenerative changes.
IMPRESSION: No acute abnormality.

## 2017-01-04 DIAGNOSIS — G4733 Obstructive sleep apnea (adult) (pediatric): Secondary | ICD-10-CM | POA: Diagnosis not present

## 2017-01-22 ENCOUNTER — Other Ambulatory Visit: Payer: Self-pay | Admitting: Family Medicine

## 2017-02-04 DIAGNOSIS — G4733 Obstructive sleep apnea (adult) (pediatric): Secondary | ICD-10-CM | POA: Diagnosis not present

## 2017-03-07 ENCOUNTER — Encounter: Payer: Self-pay | Admitting: Family Medicine

## 2017-03-07 ENCOUNTER — Ambulatory Visit: Payer: 59 | Admitting: Family Medicine

## 2017-03-07 VITALS — BP 124/77 | HR 63 | Temp 98.6°F | Resp 16 | Ht 71.0 in | Wt 230.8 lb

## 2017-03-07 DIAGNOSIS — E039 Hypothyroidism, unspecified: Secondary | ICD-10-CM

## 2017-03-07 DIAGNOSIS — K219 Gastro-esophageal reflux disease without esophagitis: Secondary | ICD-10-CM

## 2017-03-07 DIAGNOSIS — Z23 Encounter for immunization: Secondary | ICD-10-CM

## 2017-03-07 LAB — TSH: TSH: 2.63 u[IU]/mL (ref 0.35–4.50)
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
## 2017-03-07 NOTE — Patient Instructions (Signed)
Obesity, Adult Obesity is having too much body fat. If you have a BMI of 30 or more, you are obese. BMI is a number that explains how much body fat you have. Obesity is often caused by taking in (consuming) more calories than your body uses. Obesity can cause serious health problems. Changing your lifestyle can help to treat obesity. Follow these instructions at home: Eating and drinking   Follow advice from your doctor about what to eat and drink. Your doctor may tell you to: ? Cut down on (limit) fast foods, sweets, and processed snack foods. ? Choose low-fat options. For example, choose low-fat milk instead of whole milk. ? Eat 5 or more servings of fruits or vegetables every day. ? Eat at home more often. This gives you more control over what you eat. ? Choose healthy foods when you eat out. ? Learn what a healthy portion size is. A portion size is the amount of a certain food that is healthy for you to eat at one time. This is different for each person. ? Keep low-fat snacks available. ? Avoid sugary drinks. These include soda, fruit juice, iced tea that is sweetened with sugar, and flavored milk. ? Eat a healthy breakfast.  Drink enough water to keep your pee (urine) clear or pale yellow.  Do not go without eating for long periods of time (do not fast).  Do not go on popular or trendy diets (fad diets). Physical Activity  Exercise often, as told by your doctor. Ask your doctor: ? What types of exercise are safe for you. ? How often you should exercise.  Warm up and stretch before being active.  Do slow stretching after being active (cool down).  Rest between times of being active. Lifestyle  Limit how much time you spend in front of your TV, computer, or video game system (be less sedentary).  Find ways to reward yourself that do not involve food.  Limit alcohol intake to no more than 1 drink a day for nonpregnant women and 2 drinks a day for men. One drink equals 12 oz  of beer, 5 oz of wine, or 1 oz of hard liquor. General instructions  Keep a weight loss journal. This can help you keep track of: ? The food that you eat. ? The exercise that you do.  Take over-the-counter and prescription medicines only as told by your doctor.  Take vitamins and supplements only as told by your doctor.  Think about joining a support group. Your doctor may be able to help with this.  Keep all follow-up visits as told by your doctor. This is important. Contact a doctor if:  You cannot meet your weight loss goal after you have changed your diet and lifestyle for 6 weeks. This information is not intended to replace advice given to you by your health care provider. Make sure you discuss any questions you have with your health care provider. Document Released: 03/25/2011 Document Revised: 06/08/2015 Document Reviewed: 10/19/2014 Elsevier Interactive Patient Education  2018 Elsevier Inc.  

## 2017-03-10 ENCOUNTER — Encounter: Payer: Self-pay | Admitting: *Deleted

## 2017-03-17 DIAGNOSIS — J019 Acute sinusitis, unspecified: Secondary | ICD-10-CM | POA: Diagnosis not present

## 2017-03-17 DIAGNOSIS — J029 Acute pharyngitis, unspecified: Secondary | ICD-10-CM | POA: Diagnosis not present

## 2017-04-04 ENCOUNTER — Other Ambulatory Visit: Payer: Self-pay | Admitting: Family Medicine

## 2017-04-08 ENCOUNTER — Encounter: Payer: Self-pay | Admitting: *Deleted

## 2017-04-08 ENCOUNTER — Encounter: Payer: Self-pay | Admitting: Family Medicine

## 2017-04-08 ENCOUNTER — Ambulatory Visit: Payer: 59 | Admitting: Family Medicine

## 2017-04-08 ENCOUNTER — Telehealth: Payer: Self-pay | Admitting: Family Medicine

## 2017-04-08 VITALS — BP 128/83 | HR 85 | Temp 98.5°F | Resp 16 | Ht 71.0 in | Wt 221.5 lb

## 2017-04-08 DIAGNOSIS — S76312A Strain of muscle, fascia and tendon of the posterior muscle group at thigh level, left thigh, initial encounter: Secondary | ICD-10-CM | POA: Diagnosis not present

## 2017-04-08 NOTE — Telephone Encounter (Signed)
Detailed message left on voice mail. Okay per DPR. 

## 2017-04-08 NOTE — Telephone Encounter (Signed)
Yes, this could be helpful so he should try it.-thx

## 2017-04-08 NOTE — Telephone Encounter (Signed)
Copied from CRM [phone]. Topic: Quick Communication - See Telephone Encounter >> Apr 08, 2017 11:18 AM Lelon FrohlichGolden, Lucrezia Dehne, RMA wrote: CRM for notification. See Telephone encounter for: 04/08/17.pt called back and wanted to know if it is ok to wrap his leg with an ace bandage if that would help while he's at work

## 2017-04-08 NOTE — Patient Instructions (Signed)
Apply ice for 20 min twice a day for the next 2 days, then change to applying heat 20 min twice a day. Take 2 aleve twice a day with food for 10d (samples). Do home rehab.   Hamstring Strain Rehab Ask your health care provider which exercises are safe for you. Do exercises exactly as told by your health care provider and adjust them as directed. It is normal to feel mild stretching, pulling, tightness, or discomfort as you do these exercises, but you should stop right away if you feel sudden pain or your pain gets worse.Do not begin these exercises until told by your health care provider. Strengthening exercises These exercises build strength and endurance in your thighs. Endurance is the ability to use your muscles for a long time, even after your muscles get tired. Exercise A: Straight leg raises ( hip extensors) 1. Lie on your belly on a firm surface. 2. Tense the muscles in your buttocks to lift your left / right leg about 4 inches (10 cm). Keep your knee straight. 3. If you cannot lift your leg this high without arching your back, place a pillow under your hips. 4. Hold the position for __________ seconds. 5. Slowly lower your leg to the starting position and allow it to relax completely before you start the next repetition. Repeat __________ times. Complete this exercise __________ times a day. Exercise B: Bridge ( hip extensors) 1. Lie on your back on a firm surface with your knees bent and your feet flat on the floor. 2. Tighten your buttocks muscles and lift your bottom off the floor until your trunk is level with your thighs. ? You should feel the muscles working in your buttocks and the back of your thighs. ? Do not arch your back. 3. Hold this position for __________ seconds. 4. Slowly lower your hips to the starting position. 5. Let your buttocks muscles relax completely between repetitions. Repeat __________ times. Complete this exercise __________ times a day. If told by  your health care provider, keep your bottom lifted off the floor while you slowly walk your feet away from you as far as you can control. Hold for __________ seconds, then slowly walk your feet back toward you. Exercise C: Lateral walking with band ( hip abductors) 1. Stand in a long hallway. 2. Wrap a loop of exercise band around your legs, just above your knees. 3. Bend your knees gently and drop your hips down and back so your weight is over your heels. 4. Step to the side to move down the length of the hallway, keeping your toes pointed ahead of you and keeping tension in the band. 5. Repeat, leading with your other leg. Repeat __________ times. Complete this exercise __________ times a day. Exercise D: Single leg stand with reaching ( eccentric hamstring) 1. Stand on your left / right foot. Keep your big toe down on the floor and try to keep your arch lifted. 2. Slowly reach down toward the floor as far as you can while keeping your balance. 3. Hold this position for __________ seconds. Repeat __________ times. Complete this exercise __________ times a day. Exercise E: Prone plank ( abdominals and core) 1. Lie on your belly on the floor and prop yourself up on your elbows. Your hands should be straight out in front of you, and your elbows should be below your shoulders. Position your feet so the balls of your feet touch the ground. The ball of your foot is on the  walking surface, right under your toes. 2. Tighten your abdominal muscles and lift your body off the floor. ? Do not arch your back. ? Do not hold your breath. 3. Hold this position for __________ seconds. Repeat __________ times. Complete this exercise __________ times a day. This information is not intended to replace advice given to you by your health care provider. Make sure you discuss any questions you have with your health care provider. Document Released: 12/31/2004 Document Revised: 09/07/2015 Document Reviewed:  09/29/2014 Elsevier Interactive Patient Education  [employer]2018 Elsevier Inc.
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
## 2017-05-22 ENCOUNTER — Other Ambulatory Visit: Payer: Self-pay | Admitting: Family Medicine

## 2017-06-04 ENCOUNTER — Ambulatory Visit: Payer: 59 | Admitting: Family Medicine

## 2017-06-04 ENCOUNTER — Encounter: Payer: Self-pay | Admitting: *Deleted

## 2017-06-04 ENCOUNTER — Encounter: Payer: Self-pay | Admitting: Family Medicine

## 2017-06-04 VITALS — BP 134/89 | HR 65 | Temp 98.2°F | Resp 16 | Ht 71.0 in | Wt 218.0 lb

## 2017-06-04 DIAGNOSIS — B349 Viral infection, unspecified: Secondary | ICD-10-CM | POA: Diagnosis not present

## 2017-06-04 DIAGNOSIS — K1120 Sialoadenitis, unspecified: Secondary | ICD-10-CM

## 2017-06-04 MED ORDER — CEFDINIR 300 MG PO CAPS: 300.0000 mg | ORAL_CAPSULE | Freq: Two times a day (BID) | ORAL | 0 refills | Status: DC
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
## 2017-09-18 ENCOUNTER — Ambulatory Visit: Payer: 59 | Admitting: Family Medicine

## 2017-09-18 ENCOUNTER — Encounter: Payer: Self-pay | Admitting: Family Medicine

## 2017-09-18 VITALS — BP 123/81 | HR 59 | Temp 98.7°F | Resp 20 | Ht 71.0 in | Wt 226.0 lb

## 2017-09-18 DIAGNOSIS — J0100 Acute maxillary sinusitis, unspecified: Secondary | ICD-10-CM | POA: Diagnosis not present

## 2017-09-18 MED ORDER — CEFDINIR 300 MG PO CAPS: 300.0000 mg | ORAL_CAPSULE | Freq: Two times a day (BID) | ORAL | 0 refills | Status: DC

## 2017-09-18 MED ORDER — IPRATROPIUM BROMIDE 0.03 % NA SOLN: 2.0000 | Freq: Two times a day (BID) | NASAL | 3 refills | Status: DC
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
## 2017-09-18 NOTE — Patient Instructions (Addendum)
Rest, hydrate.  + flonase, mucinex (DM if cough), nettie pot or nasal saline.  Do not use afrin more than 3 days  omnicef prescribed, take until completed.  Refill Atrovent nasal spray for you also If cough present it can last up to 6-8 weeks.  F/U 2 weeks of not improved.    Sinusitis, Adult Sinusitis is soreness and inflammation of your sinuses. Sinuses are hollow spaces in the bones around your face. They are located:  Around your eyes.  In the middle of your forehead.  Behind your nose.  In your cheekbones.  Your sinuses and nasal passages are lined with a stringy fluid (mucus). Mucus normally drains out of your sinuses. When your nasal tissues get inflamed or swollen, the mucus can get trapped or blocked so air cannot flow through your sinuses. This lets bacteria, viruses, and funguses grow, and that leads to infection. Follow these instructions at home: Medicines  Take, use, or apply over-the-counter and prescription medicines only as told by your doctor. These may include nasal sprays.  If you were prescribed an antibiotic medicine, take it as told by your doctor. Do not stop taking the antibiotic even if you start to feel better. Hydrate and Humidify  Drink enough water to keep your pee (urine) clear or pale yellow.  Use a cool mist humidifier to keep the humidity level in your home above 50%.  Breathe in steam for 10-15 minutes, 3-4 times a day or as told by your doctor. You can do this in the bathroom while a hot shower is running.  Try not to spend time in cool or dry air. Rest  Rest as much as possible.  Sleep with your head raised (elevated).  Make sure to get enough sleep each night. General instructions  Put a warm, moist washcloth on your face 3-4 times a day or as told by your doctor. This will help with discomfort.  Wash your hands often with soap and water. If there is no soap and water, use hand sanitizer.  Do not smoke. Avoid being around people  who are smoking (secondhand smoke).  Keep all follow-up visits as told by your doctor. This is important. Contact a doctor if:  You have a fever.  Your symptoms get worse.  Your symptoms do not get better within 10 days. Get help right away if:  You have a very bad headache.  You cannot stop throwing up (vomiting).  You have pain or swelling around your face or eyes.  You have trouble seeing.  You feel confused.  Your neck is stiff.  You have trouble breathing. This information is not intended to replace advice given to you by your health care provider. Make sure you discuss any questions you have with your health care provider. Document Released: 06/19/2007 Document Revised: 08/27/2015 Document Reviewed: 10/26/2014 Elsevier Interactive Patient Education  [employer].

## 2017-09-26 ENCOUNTER — Other Ambulatory Visit: Payer: Self-pay | Admitting: Family Medicine

## 2017-10-29 ENCOUNTER — Encounter (HOSPITAL_BASED_OUTPATIENT_CLINIC_OR_DEPARTMENT_OTHER): Payer: Self-pay | Admitting: Emergency Medicine

## 2017-10-29 ENCOUNTER — Other Ambulatory Visit: Payer: Self-pay

## 2017-10-29 ENCOUNTER — Emergency Department (HOSPITAL_BASED_OUTPATIENT_CLINIC_OR_DEPARTMENT_OTHER)
Admission: EM | Admit: 2017-10-29 | Discharge: 2017-10-30 | Disposition: A | Discharge status: Home / Self Care | Payer: 59 | Attending: Emergency Medicine | Admitting: Emergency Medicine

## 2017-10-29 DIAGNOSIS — M545 Low back pain: Secondary | ICD-10-CM | POA: Diagnosis not present

## 2017-10-29 DIAGNOSIS — K5730 Diverticulosis of large intestine without perforation or abscess without bleeding: Secondary | ICD-10-CM | POA: Diagnosis not present

## 2017-10-29 DIAGNOSIS — R109 Unspecified abdominal pain: Secondary | ICD-10-CM | POA: Diagnosis not present

## 2017-10-29 DIAGNOSIS — F17220 Nicotine dependence, chewing tobacco, uncomplicated: Secondary | ICD-10-CM | POA: Diagnosis not present

## 2017-10-29 DIAGNOSIS — Z7982 Long term (current) use of aspirin: Secondary | ICD-10-CM | POA: Insufficient documentation

## 2017-10-29 DIAGNOSIS — E039 Hypothyroidism, unspecified: Secondary | ICD-10-CM | POA: Diagnosis not present

## 2017-10-29 DIAGNOSIS — Z79899 Other long term (current) drug therapy: Secondary | ICD-10-CM | POA: Insufficient documentation

## 2017-10-29 MED ORDER — KETOROLAC TROMETHAMINE 30 MG/ML IJ SOLN
30.0000 mg | Freq: Once | INTRAMUSCULAR | Status: AC
  Administered 2017-10-30: 30 mg via INTRAVENOUS
  Filled 2017-10-29: qty 1

## 2017-10-29 NOTE — ED Triage Notes (Signed)
Pt c/o 6/10 right flank pain for the past 2 days getting worse tonight, pt denies any urinary symptoms.

## 2017-10-29 NOTE — ED Provider Notes (Signed)
TIME SEEN: 11:54 PM  CHIEF COMPLAINT: Flank pain  HPI: Patient is a 47 year old male with history of hyperlipidemia, hypothyroidism, previous L5/S1 back surgery x2 who presents emergency department with right lower back pain.  States he is concerned that he may have a possible kidney stone.  No previous history of kidney stone.  No injury to his back.  Symptoms seem to change with palpation.  States pain feels better if he is walking.  No numbness, tingling, focal weakness.  No bowel or bladder incontinence.  No history of recent back surgery in the past year or epidural injections.  Able to ambulate without difficulty.  States he is concerned this is a kidney stone because he drinks 12 sodas a day.    ROS: See HPI Constitutional: no fever  Eyes: no drainage  ENT: no runny nose   Cardiovascular:  no chest pain  Resp: no SOB  GI: no vomiting GU: no dysuria Integumentary: no rash  Allergy: no hives  Musculoskeletal: no leg swelling  Neurological: no slurred speech ROS otherwise negative  PAST MEDICAL HISTORY/PAST SURGICAL HISTORY:  Past Medical History:  Diagnosis Date  . Allergic rhinitis   . Anxiety and depression   . Chronic low back pain   . Diverticulitis   . Elevated blood pressure reading without diagnosis of hypertension   . GERD (gastroesophageal reflux disease)   . History of myocarditis 1998   . Hypertriglyceridemia    mild.  TLC 2018  . Hypothyroidism   . Lateral epicondylitis of both elbows 2014   with tendon tears bilat (Ortho= Dr. Ike Bene at [facility])  . Obesity, Class I, BMI 30-34.9   . OSA (obstructive sleep apnea) 11/2015   with PLMS; CPAP titration done 01/2016  . Osteoarthritis of lumbar spine    hx of ruptured lumbar disc; surgery 2016 and 2018.  Marland Kitchen Pneumonia   . REM sleep behavior disorder    REM dependent parasomnia  . Shift work sleep disorder     MEDICATIONS:  Prior to Admission medications   Medication Sig Start Date End Date Taking?  Authorizing Provider  aspirin 81 MG chewable tablet Chew 1 tablet (81 mg total) by mouth daily. May resume 5 days post-op 07/31/16   Dorothy Spark, PA-C  cefdinir (OMNICEF) 300 MG capsule Take 1 capsule (300 mg total) by mouth 2 (two) times daily. 09/18/17   Kuneff, Renee A, DO  ipratropium (ATROVENT) 0.03 % nasal spray Place 2 sprays into both nostrils every 12 (twelve) hours. 09/18/17   Kuneff, Renee A, DO  levothyroxine (SYNTHROID, LEVOTHROID) 88 MCG tablet TAKE 1 TABLET BY MOUTH EVERY DAY BEFORE BREAKFAST 11/17/16   [name], Maryjean Morn, MD  pantoprazole (PROTONIX) 40 MG tablet TAKE 1 TABLET BY MOUTH EVERY DAY 09/26/17   [name], Maryjean Morn, MD  PARoxetine (PAXIL) 20 MG tablet TAKE 1&1/2 TABLETS BY MOUTH EVERY MORNING 05/22/17   [name], Maryjean Morn, MD    ALLERGIES:  Allergies  Allergen Reactions  . Duloxetine Other (See Comments)    Abdominal pain  . Erythromycin Rash  . Penicillins Hives    All cillin..Has patient had a PCN reaction causing immediate rash, facial/tongue/throat swelling, SOB or lightheadedness with hypotension: No Has patient had a PCN reaction causing severe rash involving mucus membranes or skin necrosis: NO Has patient had a PCN reaction that required hospitalization No Has patient had a PCN reaction occurring within the last 10 years: No If all of the above answers are "NO", then may proceed with Cephalosporin  use.     SOCIAL HISTORY:  Social History   Tobacco Use  . Smoking status: Former Smoker    Last attempt to quit: 2008    Years since quitting: 11.7  . Smokeless tobacco: Current User    Types: Chew  Substance Use Topics  . Alcohol use: Yes    Alcohol/week: 4.0 standard drinks    Types: 4 Cans of beer per week    Comment: occasionally     FAMILY HISTORY: Family History  Problem Relation Age of Onset  . Cancer Mother   . Hypertension Mother   . Diabetes Maternal Grandmother   . Colon cancer Neg Hx   . Esophageal cancer Neg Hx   . Rectal cancer Neg Hx    . Stomach cancer Neg Hx     EXAM: BP (!) 144/101 (BP Location: Left Arm)   Pulse 77   Temp 98.5 F (36.9 C) (Oral)   Resp 18   Ht 5\' 11"  (1.803 m)   Wt 103.4 kg   SpO2 98%   BMI 31.80 kg/m  CONSTITUTIONAL: Alert and oriented and responds appropriately to questions.  Patient appears slightly uncomfortable HEAD: Normocephalic EYES: Conjunctivae clear, pupils appear equal, EOMI ENT: normal nose; moist mucous membranes NECK: Supple, no meningismus, no nuchal rigidity, no LAD  CARD: RRR; S1 and S2 appreciated; no murmurs, no clicks, no rubs, no gallops RESP: Normal chest excursion without splinting or tachypnea; breath sounds clear and equal bilaterally; no wheezes, no rhonchi, no rales, no hypoxia or respiratory distress, speaking full sentences ABD/GI: Normal bowel sounds; non-distended; soft, non-tender, no rebound, no guarding, no peritoneal signs, no hepatosplenomegaly BACK:  The back appears normal and is non-tender to palpation, there is no CVA tenderness, no midline spinal tenderness or step-off or deformity, pain is not reproducible with palpation EXT: Normal ROM in all joints; non-tender to palpation; no edema; normal capillary refill; no cyanosis, no calf tenderness or swelling    SKIN: Normal color for age and race; warm; no rash NEURO: Moves all extremities equally normal gait, sensation to light touch intact diffusely, no saddle anesthesia PSYCH: The patient's mood and manner are appropriate. Grooming and personal hygiene are appropriate.  MEDICAL DECISION MAKING: Patient here with right flank pain.  Concern for possible kidney stone.  Will obtain labs, CTU.  Will give Toradol for pain control.  ED PROGRESS: Labs, urine unremarkable.  CT scan shows no acute abnormality.  Suspect this is more likely muscular skeletal pain.  Doubt dissection.  No red flag symptoms to suggest cauda equina, spinal stenosis, epidural abscess or hematoma, discitis or osteomyelitis, transverse  myelitis.  Will discharge with prescriptions of ibuprofen, Vicodin for pain control.  Discussed return precautions.  Discussed supportive care instructions.   At this time, I do not feel there is any life-threatening condition present. I have reviewed and discussed all results (EKG, imaging, lab, urine as appropriate) and exam findings with patient/family. I have reviewed nursing notes and appropriate previous records.  I feel the patient is safe to be discharged home without further emergent workup and can continue workup as an outpatient as needed. Discussed usual and customary return precautions. Patient/family verbalize understanding and are comfortable with this plan.  Outpatient follow-up has been provided if needed. All questions have been answered.      Ranesha Val, Layla Maw, DO 10/30/17 [phone]

## 2017-10-30 ENCOUNTER — Emergency Department (HOSPITAL_BASED_OUTPATIENT_CLINIC_OR_DEPARTMENT_OTHER): Payer: 59

## 2017-10-30 DIAGNOSIS — K5730 Diverticulosis of large intestine without perforation or abscess without bleeding: Secondary | ICD-10-CM | POA: Diagnosis not present

## 2017-10-30 LAB — CBC WITH DIFFERENTIAL/PLATELET
Abs Immature Granulocytes: 0.19 10*3/uL — ABNORMAL HIGH (ref 0.00–0.07)
BASOS ABS: 0.2 10*3/uL — AB (ref 0.0–0.1)
Basophils Relative: 2 %
EOS PCT: 6 %
Eosinophils Absolute: 0.7 10*3/uL — ABNORMAL HIGH (ref 0.0–0.5)
HCT: 44.9 % (ref 39.0–52.0)
HEMOGLOBIN: 14.9 g/dL (ref 13.0–17.0)
Immature Granulocytes: 2 %
LYMPHS PCT: 31 %
Lymphs Abs: 3.2 10*3/uL (ref 0.7–4.0)
MCH: 30.2 pg (ref 26.0–34.0)
MCHC: 33.2 g/dL (ref 30.0–36.0)
MCV: 91.1 fL (ref 80.0–100.0)
MONO ABS: 0.9 10*3/uL (ref 0.1–1.0)
Monocytes Relative: 9 %
NRBC: 0 % (ref 0.0–0.2)
Neutro Abs: 5.4 10*3/uL (ref 1.7–7.7)
Neutrophils Relative %: 50 %
Platelets: 446 10*3/uL — ABNORMAL HIGH (ref 150–400)
RBC: 4.93 MIL/uL (ref 4.22–5.81)
RDW: 12.6 % (ref 11.5–15.5)
WBC: 10.5 10*3/uL (ref 4.0–10.5)

## 2017-10-30 LAB — URINALYSIS, ROUTINE W REFLEX MICROSCOPIC
BILIRUBIN URINE: NEGATIVE
Glucose, UA: NEGATIVE mg/dL
Hgb urine dipstick: NEGATIVE
KETONES UR: NEGATIVE mg/dL
Leukocytes, UA: NEGATIVE
NITRITE: NEGATIVE
PH: 6 (ref 5.0–8.0)
Protein, ur: NEGATIVE mg/dL
Specific Gravity, Urine: 1.025 (ref 1.005–1.030)

## 2017-10-30 LAB — COMPREHENSIVE METABOLIC PANEL
ALBUMIN: 4.2 g/dL (ref 3.5–5.0)
ALT: 28 U/L (ref 0–44)
AST: 23 U/L (ref 15–41)
Alkaline Phosphatase: 63 U/L (ref 38–126)
Anion gap: 8 (ref 5–15)
BILIRUBIN TOTAL: 0.6 mg/dL (ref 0.3–1.2)
BUN: 19 mg/dL (ref 6–20)
CO2: 23 mmol/L (ref 22–32)
CREATININE: 0.82 mg/dL (ref 0.61–1.24)
Calcium: 9.3 mg/dL (ref 8.9–10.3)
Chloride: 103 mmol/L (ref 98–111)
GFR calc non Af Amer: >60 mL/min (ref >60)
Glucose, Bld: 116 mg/dL — ABNORMAL HIGH (ref 70–99)
POTASSIUM: 4.1 mmol/L (ref 3.5–5.1)
SODIUM: 134 mmol/L — AB (ref 135–145)
TOTAL PROTEIN: 7.8 g/dL (ref 6.5–8.1)

## 2017-10-30 MED ORDER — HYDROCODONE-ACETAMINOPHEN 5-325 MG PO TABS: 2.0000 | ORAL_TABLET | Freq: Four times a day (QID) | ORAL | 0 refills | Status: DC | PRN

## 2017-10-30 MED ORDER — ONDANSETRON 4 MG PO TBDP: 4.0000 mg | ORAL_TABLET | Freq: Four times a day (QID) | ORAL | 0 refills | Status: DC | PRN

## 2017-10-30 MED ORDER — IBUPROFEN 800 MG PO TABS: 800.0000 mg | ORAL_TABLET | Freq: Three times a day (TID) | ORAL | 0 refills | Status: DC | PRN

## 2017-11-16 ENCOUNTER — Other Ambulatory Visit: Payer: Self-pay | Admitting: Family Medicine

## 2017-11-19 ENCOUNTER — Other Ambulatory Visit: Payer: Self-pay | Admitting: Family Medicine

## 2017-12-08 ENCOUNTER — Encounter: Payer: Self-pay | Admitting: *Deleted

## 2017-12-08 DIAGNOSIS — F419 Anxiety disorder, unspecified: Secondary | ICD-10-CM

## 2017-12-08 DIAGNOSIS — F329 Major depressive disorder, single episode, unspecified: Secondary | ICD-10-CM | POA: Insufficient documentation

## 2017-12-09 ENCOUNTER — Encounter: Payer: Self-pay | Admitting: Family Medicine

## 2017-12-09 ENCOUNTER — Ambulatory Visit (INDEPENDENT_AMBULATORY_CARE_PROVIDER_SITE_OTHER): Payer: 59 | Admitting: Family Medicine

## 2017-12-09 VITALS — BP 165/83 | HR 95 | Temp 98.8°F | Resp 16 | Ht 71.0 in | Wt 231.2 lb

## 2017-12-09 DIAGNOSIS — Z23 Encounter for immunization: Secondary | ICD-10-CM

## 2017-12-09 DIAGNOSIS — E039 Hypothyroidism, unspecified: Secondary | ICD-10-CM

## 2017-12-09 DIAGNOSIS — E781 Pure hyperglyceridemia: Secondary | ICD-10-CM | POA: Diagnosis not present

## 2017-12-09 DIAGNOSIS — Z0000 Encounter for general adult medical examination without abnormal findings: Secondary | ICD-10-CM | POA: Diagnosis not present

## 2017-12-09 MED ORDER — PAROXETINE HCL 20 MG PO TABS: ORAL_TABLET | ORAL | 11 refills | Status: DC

## 2017-12-09 MED ORDER — PANTOPRAZOLE SODIUM 40 MG PO TBEC: 40.0000 mg | DELAYED_RELEASE_TABLET | Freq: Every day | ORAL | 11 refills | Status: DC

## 2017-12-09 NOTE — Patient Instructions (Signed)
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
## 2017-12-10 ENCOUNTER — Encounter: Payer: Self-pay | Admitting: Family Medicine

## 2017-12-10 ENCOUNTER — Other Ambulatory Visit (INDEPENDENT_AMBULATORY_CARE_PROVIDER_SITE_OTHER): Payer: 59

## 2017-12-10 DIAGNOSIS — E039 Hypothyroidism, unspecified: Secondary | ICD-10-CM | POA: Diagnosis not present

## 2017-12-10 DIAGNOSIS — E781 Pure hyperglyceridemia: Secondary | ICD-10-CM | POA: Diagnosis not present

## 2017-12-10 LAB — LIPID PANEL
Cholesterol: 214 mg/dL — ABNORMAL HIGH (ref 0–200)
HDL: 46.2 mg/dL
NonHDL: 167.6
Total CHOL/HDL Ratio: 5
Triglycerides: 211 mg/dL — ABNORMAL HIGH (ref 0.0–149.0)
VLDL: 42.2 mg/dL — ABNORMAL HIGH (ref 0.0–40.0)

## 2017-12-10 LAB — TSH: TSH: 3.2 u[IU]/mL (ref 0.35–4.50)

## 2017-12-10 LAB — LDL CHOLESTEROL, DIRECT: Direct LDL: 145 mg/dL

## 2017-12-15 ENCOUNTER — Encounter: Payer: Self-pay | Admitting: *Deleted

## 2018-02-12 ENCOUNTER — Other Ambulatory Visit: Payer: Self-pay | Admitting: Family Medicine

## 2018-02-13 ENCOUNTER — Ambulatory Visit: Payer: 59 | Admitting: Family Medicine

## 2018-02-13 ENCOUNTER — Encounter: Payer: Self-pay | Admitting: Family Medicine

## 2018-02-13 VITALS — BP 147/81 | HR 73 | Temp 98.7°F | Resp 16 | Ht 71.0 in | Wt 234.0 lb

## 2018-02-13 DIAGNOSIS — M25511 Pain in right shoulder: Secondary | ICD-10-CM

## 2018-02-13 DIAGNOSIS — M7581 Other shoulder lesions, right shoulder: Secondary | ICD-10-CM

## 2018-02-13 MED ORDER — METHYLPREDNISOLONE ACETATE 80 MG/ML IJ SUSP
80.0000 mg | Freq: Once | INTRAMUSCULAR | Status: AC
  Administered 2018-02-13: 80 mg via INTRAMUSCULAR
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
## 2018-02-13 NOTE — Addendum Note (Signed)
Addended by: Smitty Knudsen on: 02/13/2018 10:19 AM   Modules accepted: Orders

## 2018-02-19 ENCOUNTER — Other Ambulatory Visit: Payer: Self-pay | Admitting: *Deleted

## 2018-02-19 MED ORDER — IPRATROPIUM BROMIDE 0.03 % NA SOLN: 2.0000 | Freq: Two times a day (BID) | NASAL | 3 refills | Status: DC

## 2018-03-15 DIAGNOSIS — M1710 Unilateral primary osteoarthritis, unspecified knee: Secondary | ICD-10-CM

## 2018-03-15 HISTORY — DX: Unilateral primary osteoarthritis, unspecified knee: M17.10

## 2018-03-16 ENCOUNTER — Ambulatory Visit: Payer: 59 | Admitting: Family Medicine

## 2018-03-16 ENCOUNTER — Encounter: Payer: Self-pay | Admitting: *Deleted

## 2018-03-16 ENCOUNTER — Encounter: Payer: Self-pay | Admitting: Family Medicine

## 2018-03-16 ENCOUNTER — Ambulatory Visit (INDEPENDENT_AMBULATORY_CARE_PROVIDER_SITE_OTHER): Payer: 59

## 2018-03-16 VITALS — BP 134/82 | HR 67 | Temp 97.9°F | Resp 16 | Ht 71.0 in | Wt 230.2 lb

## 2018-03-16 DIAGNOSIS — G8929 Other chronic pain: Secondary | ICD-10-CM | POA: Diagnosis not present

## 2018-03-16 DIAGNOSIS — M25562 Pain in left knee: Secondary | ICD-10-CM

## 2018-03-16 DIAGNOSIS — M1712 Unilateral primary osteoarthritis, left knee: Secondary | ICD-10-CM | POA: Diagnosis not present

## 2018-03-16 MED ORDER — METHYLPREDNISOLONE ACETATE 80 MG/ML IJ SUSP
80.0000 mg | Freq: Once | INTRAMUSCULAR | Status: AC
  Administered 2018-03-16: 80 mg via INTRAMUSCULAR
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
## 2018-03-17 ENCOUNTER — Ambulatory Visit: Payer: Self-pay

## 2018-03-17 NOTE — Telephone Encounter (Signed)
Patient called, left VM to return call to the office to speak to a [place]. 

## 2018-03-17 NOTE — Telephone Encounter (Signed)
Patient called and says that he was seen in the office yesterday and received an injection in his left knee. He says last night, he had pain up to an 8-9, so bad he almost went to the ED. He says he iced it and took Ibuprofen. He says he's heading to work today and the pain is a 5-7 and he knows the pain will get worse. He says the knee is a little swollen, but not bad. He describes the pain as a lot of pressure with a toothache feeling on the inside of the knee towards the left. He denies other symptoms and redness of the knee.  He's asking for something to be sent for the pain. I advised I will send this request to Dr. Milinda Cave and someone will call back with the recommendation, he verbalized understanding.  Reason for Disposition . Knee pain  Answer Assessment - Initial Assessment Questions 1. LOCATION and RADIATION: "Where is the pain located?"      Left knee 2. QUALITY: "What does the pain feel like?"  (e.g., sharp, dull, aching, burning)     Pressure, toothache on the inside on the left 3. SEVERITY: "How bad is the pain?" "What does it keep you from doing?"   (Scale 1-10; or mild, moderate, severe)   -  MILD (1-3): doesn't interfere with normal activities    -  MODERATE (4-7): interferes with normal activities (e.g., work or school) or awakens from sleep, limping    -  SEVERE (8-10): excruciating pain, unable to do any normal activities, unable to walk     5-7 4. ONSET: "When did the pain start?" "Does it come and go, or is it there all the time?"     Last night after receiving the injection in the office 5. RECURRENT: "Have you had this pain before?" If so, ask: "When, and what happened then?"     Yes, but not this bad 6. SETTING: "Has there been any recent work, exercise or other activity that involved that part of the body?"     No 7. AGGRAVATING FACTORS: "What makes the knee pain worse?" (e.g., walking, climbing stairs, running)     Walking 8. ASSOCIATED SYMPTOMS: "Is there any  swelling or redness of the knee?"     Yes, swelling 9. OTHER SYMPTOMS: "Do you have any other symptoms?" (e.g., chest pain, difficulty breathing, fever, calf pain)     No 10. PREGNANCY: "Is there any chance you are pregnant?" "When was your last menstrual period?"       N/A  Protocols used: KNEE PAIN-A-AH

## 2018-03-18 ENCOUNTER — Other Ambulatory Visit: Payer: Self-pay | Admitting: Family Medicine

## 2018-03-18 MED ORDER — HYDROCODONE-ACETAMINOPHEN 5-325 MG PO TABS: 1.0000 | ORAL_TABLET | Freq: Four times a day (QID) | ORAL | 0 refills | Status: DC | PRN

## 2018-03-18 NOTE — Telephone Encounter (Signed)
Vicodin eRx'd for pain. If knee still swollen and with worse pain tomorrow then he should come back in and let me look at the knee again.-thx

## 2018-03-18 NOTE — Telephone Encounter (Signed)
Pt advised and voiced understanding.   

## 2018-03-18 NOTE — Telephone Encounter (Signed)
Left message for pt to call back  °

## 2018-03-26 DIAGNOSIS — G8929 Other chronic pain: Secondary | ICD-10-CM | POA: Diagnosis not present

## 2018-03-26 DIAGNOSIS — M6281 Muscle weakness (generalized): Secondary | ICD-10-CM | POA: Diagnosis not present

## 2018-03-26 DIAGNOSIS — M25562 Pain in left knee: Secondary | ICD-10-CM | POA: Diagnosis not present

## 2018-06-13 ENCOUNTER — Other Ambulatory Visit: Payer: Self-pay | Admitting: Family Medicine

## 2018-07-10 ENCOUNTER — Other Ambulatory Visit: Payer: Self-pay | Admitting: Family Medicine

## 2018-07-20 ENCOUNTER — Encounter: Payer: Self-pay | Admitting: Family Medicine

## 2018-07-20 ENCOUNTER — Ambulatory Visit: Payer: 59 | Admitting: Family Medicine

## 2018-07-20 VITALS — BP 152/94 | HR 82 | Temp 99.0°F | Resp 16 | Ht 71.0 in | Wt 234.6 lb

## 2018-07-20 DIAGNOSIS — K219 Gastro-esophageal reflux disease without esophagitis: Secondary | ICD-10-CM | POA: Diagnosis not present

## 2018-07-20 DIAGNOSIS — M5442 Lumbago with sciatica, left side: Secondary | ICD-10-CM

## 2018-07-20 DIAGNOSIS — E039 Hypothyroidism, unspecified: Secondary | ICD-10-CM | POA: Diagnosis not present

## 2018-07-20 DIAGNOSIS — F419 Anxiety disorder, unspecified: Secondary | ICD-10-CM

## 2018-07-20 DIAGNOSIS — F329 Major depressive disorder, single episode, unspecified: Secondary | ICD-10-CM

## 2018-07-20 MED ORDER — PREDNISONE 10 MG PO TABS: ORAL_TABLET | ORAL | 0 refills | Status: DC

## 2018-07-20 MED ORDER — HYDROCODONE-ACETAMINOPHEN 5-325 MG PO TABS: 1.0000 | ORAL_TABLET | Freq: Four times a day (QID) | ORAL | 0 refills | Status: DC | PRN
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
## 2018-08-04 ENCOUNTER — Other Ambulatory Visit: Payer: Self-pay | Admitting: Family Medicine

## 2018-10-06 ENCOUNTER — Ambulatory Visit: Payer: 59 | Admitting: Family Medicine

## 2018-10-07 ENCOUNTER — Ambulatory Visit: Payer: 59 | Admitting: Family Medicine

## 2018-10-16 ENCOUNTER — Ambulatory Visit: Payer: 59 | Admitting: Family Medicine

## 2018-10-16 DIAGNOSIS — Z0289 Encounter for other administrative examinations: Secondary | ICD-10-CM

## 2018-12-24 ENCOUNTER — Other Ambulatory Visit: Payer: Self-pay | Admitting: Family Medicine

## 2018-12-25 ENCOUNTER — Other Ambulatory Visit: Payer: Self-pay | Admitting: Family Medicine

## 2019-01-16 ENCOUNTER — Other Ambulatory Visit: Payer: Self-pay | Admitting: Family Medicine

## 2019-01-27 ENCOUNTER — Other Ambulatory Visit: Payer: Self-pay

## 2019-01-29 ENCOUNTER — Ambulatory Visit: Payer: 59 | Admitting: Family Medicine

## 2019-01-29 ENCOUNTER — Other Ambulatory Visit: Payer: Self-pay

## 2019-01-29 ENCOUNTER — Encounter: Payer: Self-pay | Admitting: Family Medicine

## 2019-01-29 VITALS — BP 145/91 | HR 85 | Temp 98.7°F | Resp 16 | Ht 71.0 in | Wt 246.0 lb

## 2019-01-29 DIAGNOSIS — E039 Hypothyroidism, unspecified: Secondary | ICD-10-CM | POA: Diagnosis not present

## 2019-01-29 DIAGNOSIS — K219 Gastro-esophageal reflux disease without esophagitis: Secondary | ICD-10-CM

## 2019-01-29 DIAGNOSIS — F3342 Major depressive disorder, recurrent, in full remission: Secondary | ICD-10-CM

## 2019-01-29 DIAGNOSIS — F411 Generalized anxiety disorder: Secondary | ICD-10-CM | POA: Diagnosis not present

## 2019-01-29 MED ORDER — PANTOPRAZOLE SODIUM 40 MG PO TBEC: 40.0000 mg | DELAYED_RELEASE_TABLET | Freq: Every day | ORAL | 3 refills | Status: DC

## 2019-01-29 MED ORDER — LEVOTHYROXINE SODIUM 88 MCG PO TABS: ORAL_TABLET | ORAL | 1 refills | Status: DC

## 2019-01-29 MED ORDER — PAROXETINE HCL 30 MG PO TABS: 30.0000 mg | ORAL_TABLET | Freq: Every day | ORAL | 3 refills | Status: DC
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
## 2019-01-30 LAB — TSH: TSH: 1.3 mIU/L (ref 0.40–4.50)

## 2019-02-07 DIAGNOSIS — M5416 Radiculopathy, lumbar region: Secondary | ICD-10-CM | POA: Insufficient documentation

## 2019-03-29 ENCOUNTER — Ambulatory Visit: Payer: 59 | Attending: Internal Medicine

## 2019-03-29 DIAGNOSIS — Z20822 Contact with and (suspected) exposure to covid-19: Secondary | ICD-10-CM

## 2019-03-30 LAB — NOVEL CORONAVIRUS, NAA: SARS-CoV-2, NAA: NOT DETECTED

## 2019-04-15 DIAGNOSIS — N62 Hypertrophy of breast: Secondary | ICD-10-CM

## 2019-04-15 HISTORY — DX: Hypertrophy of breast: N62

## 2019-05-10 ENCOUNTER — Telehealth: Payer: Self-pay

## 2019-05-10 ENCOUNTER — Telehealth: Payer: Self-pay | Admitting: Neurology

## 2019-05-10 DIAGNOSIS — G4750 Parasomnia, unspecified: Secondary | ICD-10-CM

## 2019-05-10 NOTE — Telephone Encounter (Signed)
Called the patient back to advise that since we have not seen him in a office visit since 11/2015 we would need to have a new referral to be able to evaluate and schedule him. Advised that once we get the referral sent by his PCP we will call and get him scheduled and have him bring his machine to his visit so we could download the machine. DME [employer] and he states he thinks it can be wireless read. I advised I would reach out and check on that for him in the meantime to make sure our office is tagged. Pt verbalized understanding about making sure that he get a new referral to be seen in office.

## 2019-05-10 NOTE — Telephone Encounter (Signed)
Patient calling to get updated CPAP referral to [facility] the one they have has expired ?  Please call patient with questions. [phone].

## 2019-05-10 NOTE — Telephone Encounter (Signed)
Pt needs new referral to [facility], last one done 11/03/15 for Parasomnia w/ Dr.Dohmeier  Please advise, thanks.

## 2019-05-10 NOTE — Telephone Encounter (Signed)
Pt is listed in [facility]ESM[facility] airview. As Nicholas Mckinney. He is non compliant with his machine.

## 2019-05-10 NOTE — Telephone Encounter (Signed)
Pt called stating he is needed his CPAP air flow changed he is starting to talk and snore in his sleep. Advised that MD will most likely want to see him back in the office since he has been seen since 2017 but requested I send the message first.

## 2019-05-11 NOTE — Telephone Encounter (Signed)
OK referral ordered. 

## 2019-05-11 NOTE — Telephone Encounter (Signed)
MyChart message read.

## 2019-05-13 ENCOUNTER — Other Ambulatory Visit: Payer: Self-pay

## 2019-05-19 ENCOUNTER — Other Ambulatory Visit: Payer: Self-pay

## 2019-05-19 ENCOUNTER — Encounter: Payer: Self-pay | Admitting: Family Medicine

## 2019-05-19 ENCOUNTER — Ambulatory Visit: Payer: 59 | Admitting: Family Medicine

## 2019-05-19 VITALS — BP 140/90 | HR 82 | Temp 98.0°F | Resp 16 | Ht 71.0 in | Wt 236.4 lb

## 2019-05-19 DIAGNOSIS — N62 Hypertrophy of breast: Secondary | ICD-10-CM | POA: Diagnosis not present

## 2019-05-19 DIAGNOSIS — N644 Mastodynia: Secondary | ICD-10-CM | POA: Diagnosis not present

## 2019-05-19 MED ORDER — IPRATROPIUM BROMIDE 0.03 % NA SOLN: 2.0000 | Freq: Two times a day (BID) | NASAL | 6 refills | Status: DC

## 2019-05-19 MED ORDER — CEFDINIR 300 MG PO CAPS: 300.0000 mg | ORAL_CAPSULE | Freq: Two times a day (BID) | ORAL | 0 refills | Status: AC
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
## 2019-05-24 ENCOUNTER — Other Ambulatory Visit: Payer: Self-pay | Admitting: Family Medicine

## 2019-05-24 DIAGNOSIS — N644 Mastodynia: Secondary | ICD-10-CM

## 2019-06-09 ENCOUNTER — Other Ambulatory Visit: Payer: Self-pay

## 2019-06-09 ENCOUNTER — Ambulatory Visit: Payer: 59

## 2019-06-09 ENCOUNTER — Ambulatory Visit
Admission: RE | Admit: 2019-06-09 | Discharge: 2019-06-09 | Disposition: A | Discharge status: Home / Self Care | Payer: 59 | Source: Ambulatory Visit | Attending: Family Medicine | Admitting: Family Medicine

## 2019-06-09 DIAGNOSIS — N644 Mastodynia: Secondary | ICD-10-CM

## 2019-07-15 DIAGNOSIS — R7303 Prediabetes: Secondary | ICD-10-CM

## 2019-07-15 HISTORY — DX: Prediabetes: R73.03

## 2019-07-23 ENCOUNTER — Ambulatory Visit: Payer: 59 | Admitting: Family Medicine

## 2019-07-23 ENCOUNTER — Other Ambulatory Visit: Payer: Self-pay

## 2019-07-23 ENCOUNTER — Encounter: Payer: Self-pay | Admitting: Family Medicine

## 2019-07-23 VITALS — BP 145/87 | HR 74 | Temp 98.1°F | Resp 17 | Ht 71.0 in | Wt 235.0 lb

## 2019-07-23 DIAGNOSIS — E291 Testicular hypofunction: Secondary | ICD-10-CM

## 2019-07-23 DIAGNOSIS — E669 Obesity, unspecified: Secondary | ICD-10-CM

## 2019-07-23 DIAGNOSIS — R030 Elevated blood-pressure reading, without diagnosis of hypertension: Secondary | ICD-10-CM | POA: Diagnosis not present

## 2019-07-23 DIAGNOSIS — E039 Hypothyroidism, unspecified: Secondary | ICD-10-CM
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
## 2019-07-23 NOTE — Patient Instructions (Signed)
Check your blood pressure and heart rate once a day and write the numbers down.   Bring these in for review with me in 1 month.

## 2019-07-26 ENCOUNTER — Other Ambulatory Visit: Payer: Self-pay | Admitting: Family Medicine

## 2019-07-26 ENCOUNTER — Telehealth: Payer: Self-pay

## 2019-07-26 ENCOUNTER — Other Ambulatory Visit: Payer: Self-pay

## 2019-07-26 MED ORDER — LISINOPRIL 10 MG PO TABS
10.0000 mg | ORAL_TABLET | Freq: Every day | ORAL | 1 refills | Status: DC
Start: 2019-07-26 — End: 2019-08-26

## 2019-07-26 NOTE — Telephone Encounter (Signed)
Seen on Friday by Dr. Milinda Cave, following up regarding his blood pressure.  Patient states his reading this morning was 150/84.   He stated not much change over the weekend. 155/110. He would like to go ahead and get on blood pressure medication, says that he is quite concerned with it.    He is at work this morning, would like prescription to [facility] called into [facility] - [address], [place], [place]   Patient can be reached at [phone].

## 2019-07-26 NOTE — Telephone Encounter (Signed)
Pt was seen 07/23/2019. PCP Note states: Return in about 4 weeks (around 08/20/2019) for f/u elevated blood pressure: also needs fasting lab appt(early morning) as his earliest convenience.  Please advise

## 2019-07-26 NOTE — Telephone Encounter (Signed)
OK. I'll send in rx for lisinopril 10mg . Keep lab appt and f/u visit as already set.-thx

## 2019-07-26 NOTE — Telephone Encounter (Signed)
Patient advised and voiced understanding. [facility] transferred [facility] [address]

## 2019-07-28 ENCOUNTER — Ambulatory Visit (INDEPENDENT_AMBULATORY_CARE_PROVIDER_SITE_OTHER): Payer: 59 | Admitting: Family Medicine

## 2019-07-28 ENCOUNTER — Other Ambulatory Visit: Payer: Self-pay

## 2019-07-28 DIAGNOSIS — E039 Hypothyroidism, unspecified: Secondary | ICD-10-CM

## 2019-07-28 DIAGNOSIS — R030 Elevated blood-pressure reading, without diagnosis of hypertension: Secondary | ICD-10-CM

## 2019-07-28 DIAGNOSIS — E291 Testicular hypofunction: Secondary | ICD-10-CM

## 2019-07-28 DIAGNOSIS — E669 Obesity, unspecified: Secondary | ICD-10-CM | POA: Diagnosis not present

## 2019-07-28 LAB — COMPREHENSIVE METABOLIC PANEL
ALT: 25 U/L (ref 0–53)
AST: 21 U/L (ref 0–37)
Albumin: 4.2 g/dL (ref 3.5–5.2)
Alkaline Phosphatase: 72 U/L (ref 39–117)
BUN: 21 mg/dL (ref 6–23)
CO2: 26 mEq/L (ref 19–32)
Calcium: 9.7 mg/dL (ref 8.4–10.5)
Chloride: 103 mEq/L (ref 96–112)
Creatinine, Ser: 0.91 mg/dL (ref 0.40–1.50)
GFR: 88.21 mL/min (ref >60.00)
Glucose, Bld: 110 mg/dL — ABNORMAL HIGH (ref 70–99)
Potassium: 5 mEq/L (ref 3.5–5.1)
Sodium: 138 mEq/L (ref 135–145)
Total Bilirubin: 0.5 mg/dL (ref 0.2–1.2)
Total Protein: 7.1 g/dL (ref 6.0–8.3)

## 2019-07-28 LAB — CBC WITH DIFFERENTIAL/PLATELET
Basophils Absolute: 0.1 10*3/uL (ref 0.0–0.1)
Basophils Relative: 0.9 % (ref 0.0–3.0)
Eosinophils Absolute: 0.7 10*3/uL (ref 0.0–0.7)
Eosinophils Relative: 7.1 % — ABNORMAL HIGH (ref 0.0–5.0)
HCT: 44.9 % (ref 39.0–52.0)
Hemoglobin: 15.2 g/dL (ref 13.0–17.0)
Lymphocytes Relative: 25.8 % (ref 12.0–46.0)
Lymphs Abs: 2.7 10*3/uL (ref 0.7–4.0)
MCHC: 33.9 g/dL (ref 30.0–36.0)
MCV: 92.4 fl (ref 78.0–100.0)
Monocytes Absolute: 1.1 10*3/uL — ABNORMAL HIGH (ref 0.1–1.0)
Monocytes Relative: 10.6 % (ref 3.0–12.0)
Neutro Abs: 5.8 10*3/uL (ref 1.4–7.7)
Neutrophils Relative %: 55.6 % (ref 43.0–77.0)
Platelets: 485 10*3/uL — ABNORMAL HIGH (ref 150.0–400.0)
RBC: 4.85 Mil/uL (ref 4.22–5.81)
RDW: 14.1 % (ref 11.5–15.5)
WBC: 10.4 10*3/uL (ref 4.0–10.5)

## 2019-07-28 LAB — LIPID PANEL
Cholesterol: 194 mg/dL (ref 0–200)
HDL: 47 mg/dL (ref >39.00)
LDL Cholesterol: 120 mg/dL — ABNORMAL HIGH (ref 0–99)
NonHDL: 147.33
Total CHOL/HDL Ratio: 4
Triglycerides: 135 mg/dL (ref 0.0–149.0)
VLDL: 27 mg/dL (ref 0.0–40.0)

## 2019-07-28 LAB — LUTEINIZING HORMONE: LH: 2.28 m[IU]/mL (ref 1.50–9.30)

## 2019-07-28 LAB — HEMOGLOBIN A1C: Hgb A1c MFr Bld: 6.3 % (ref 4.6–6.5)

## 2019-07-28 LAB — TSH: TSH: 1.12 u[IU]/mL (ref 0.35–4.50)

## 2019-07-29 ENCOUNTER — Encounter: Payer: Self-pay | Admitting: Family Medicine

## 2019-07-29 LAB — TESTOSTERONE TOTAL,FREE,BIO, MALES
Albumin: 4.2 g/dL (ref 3.6–5.1)
Sex Hormone Binding: 42 nmol/L (ref 10–50)
Testosterone, Bioavailable: 71.6 ng/dL — ABNORMAL LOW (ref >=110.0)
Testosterone, Free: 37.1 pg/mL — ABNORMAL LOW (ref 46.0–224.0)
Testosterone: 348 ng/dL (ref 250–827)

## 2019-07-29 LAB — PROLACTIN: Prolactin: 8.4 ng/mL (ref 2.0–18.0)

## 2019-08-04 ENCOUNTER — Other Ambulatory Visit: Payer: Self-pay | Admitting: Family Medicine

## 2019-08-26 ENCOUNTER — Telehealth: Payer: Self-pay

## 2019-08-26 ENCOUNTER — Other Ambulatory Visit: Payer: Self-pay

## 2019-08-26 DIAGNOSIS — I10 Essential (primary) hypertension: Secondary | ICD-10-CM

## 2019-08-26 MED ORDER — LISINOPRIL 20 MG PO TABS
20.0000 mg | ORAL_TABLET | Freq: Every day | ORAL | 0 refills | Status: DC
Start: 2019-08-26 — End: 2019-09-27

## 2019-08-26 NOTE — Telephone Encounter (Signed)
Patient advised of recommendations. Rx sent in for increased dose and future lab entered in Epic. Lab and provider f/u appt scheduled.

## 2019-08-26 NOTE — Telephone Encounter (Signed)
Patient had Covid vaccine about a week ago. His bp readings were normal before vaccine. Since the vaccine his top number has been running in the 140's bottom number 90-100. Should he increase the Lisinopril?

## 2019-08-26 NOTE — Telephone Encounter (Signed)
Patient was last seen 07/23/19, advised to f/u 4 weeks for elevated BP. He has been experiencing elevated BP readings since vaccine last week. Should he increase lisinopril medication?

## 2019-08-26 NOTE — Telephone Encounter (Signed)
Yes, increase lisinopril to 20mg  qd. Pls eRx lisinopril 20mg  tabs, 1 tab po qd, #30, no RF. Needs BMET in 1 wk, dx essential hypertension (non-fasting) Office f/u 3-4 weeks-thx

## 2019-09-02 ENCOUNTER — Other Ambulatory Visit: Payer: Self-pay

## 2019-09-02 ENCOUNTER — Ambulatory Visit (INDEPENDENT_AMBULATORY_CARE_PROVIDER_SITE_OTHER): Payer: 59 | Admitting: Family Medicine

## 2019-09-02 DIAGNOSIS — I10 Essential (primary) hypertension: Secondary | ICD-10-CM

## 2019-09-02 LAB — BASIC METABOLIC PANEL
BUN: 17 mg/dL (ref 6–23)
CO2: 27 mEq/L (ref 19–32)
Calcium: 9.6 mg/dL (ref 8.4–10.5)
Chloride: 101 mEq/L (ref 96–112)
Creatinine, Ser: 0.96 mg/dL (ref 0.40–1.50)
GFR: 82.89 mL/min (ref >60.00)
Glucose, Bld: 169 mg/dL — ABNORMAL HIGH (ref 70–99)
Potassium: 5 mEq/L (ref 3.5–5.1)
Sodium: 136 mEq/L (ref 135–145)

## 2019-09-13 ENCOUNTER — Other Ambulatory Visit: Payer: Self-pay | Admitting: Family Medicine

## 2019-09-16 ENCOUNTER — Ambulatory Visit: Payer: 59 | Admitting: Family Medicine

## 2019-09-16 ENCOUNTER — Other Ambulatory Visit: Payer: Self-pay

## 2019-09-16 ENCOUNTER — Encounter: Payer: Self-pay | Admitting: Family Medicine

## 2019-09-16 VITALS — BP 94/60 | HR 52 | Temp 97.7°F | Resp 16 | Ht 71.0 in | Wt 228.2 lb

## 2019-09-16 DIAGNOSIS — I10 Essential (primary) hypertension: Secondary | ICD-10-CM | POA: Diagnosis not present

## 2019-09-16 DIAGNOSIS — R7303 Prediabetes: Secondary | ICD-10-CM | POA: Diagnosis not present
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
## 2019-09-26 ENCOUNTER — Other Ambulatory Visit: Payer: Self-pay | Admitting: Family Medicine

## 2019-11-30 ENCOUNTER — Other Ambulatory Visit: Payer: Self-pay | Admitting: Family Medicine

## 2019-12-01 ENCOUNTER — Encounter: Payer: Self-pay | Admitting: Family Medicine

## 2019-12-01 ENCOUNTER — Ambulatory Visit: Payer: 59 | Admitting: Family Medicine

## 2019-12-01 ENCOUNTER — Other Ambulatory Visit: Payer: Self-pay

## 2019-12-01 VITALS — BP 120/74 | HR 80 | Temp 98.7°F | Ht 71.0 in | Wt 233.0 lb

## 2019-12-01 DIAGNOSIS — M7521 Bicipital tendinitis, right shoulder: Secondary | ICD-10-CM | POA: Diagnosis not present

## 2019-12-01 DIAGNOSIS — Z23 Encounter for immunization: Secondary | ICD-10-CM | POA: Diagnosis not present

## 2019-12-01 DIAGNOSIS — M25511 Pain in right shoulder: Secondary | ICD-10-CM

## 2019-12-01 MED ORDER — METHYLPREDNISOLONE ACETATE 80 MG/ML IJ SUSP
80.0000 mg | Freq: Once | INTRAMUSCULAR | Status: AC
  Administered 2019-12-01: 80 mg via INTRAMUSCULAR

## 2019-12-01 MED ORDER — DICLOFENAC SODIUM ER 100 MG PO TB24: 100.0000 mg | ORAL_TABLET | Freq: Every day | ORAL | 1 refills | Status: DC

## 2019-12-01 NOTE — Patient Instructions (Addendum)
Start medication-diclofenac today- it is once a day. Take with food. Plain to take for 2 weeks. Then can stop if pain is improved.  Follow up with pcp in 2 weeks if not improved.    Proximal Biceps Tendinitis and Tenosynovitis  The proximal biceps tendon is a strong cord of tissue that connects the biceps muscle on the front of the upper arm to the shoulder blade. Tendinitis is inflammation of a tendon. Tenosynovitis is inflammation of the lining around the tendon (tendon sheath). These conditions often occur at the same time, and they can interfere with the ability to bend the elbow and turn the palm of the hand up. Proximal biceps tendinitis and tenosynovitis are usually caused by overusing the shoulder joint and the biceps muscle. These conditions usually heal within 6 weeks. Proximal biceps tendinitis may include a grade 1 or grade 2 strain of the tendon.  A grade 1 strain is mild, and it involves a slight pull of the tendon without any stretching or noticeable tearing of the tendon. There is usually no loss of biceps muscle strength.  A grade 2 strain is moderate, and it involves a small tear in the tendon. The tendon is stretched, and biceps strength is usually decreased. What are the causes? This condition may be caused by:  A sudden increase in frequency or intensity of activity that involves the shoulder and the biceps muscle.  Overuse of the biceps muscle. This can happen when you do the same movements over and over, such as: ? Turning the palm of the hand up. ? Forceful straightening (hyperextension) of the elbow. ? Bending the elbow.  A direct, forceful hit or injury to the elbow. This is rare. What increases the risk? The following factors may make you more likely to develop this condition:  Playing contact sports.  Playing sports that involve throwing and overhead movements, including racket sports, gymnastics, weight lifting, or bodybuilding.  Doing physical  labor.  Having poor strength and flexibility of the arm and shoulder. What are the signs or symptoms? Symptoms of this condition may include:  Pain and inflammation in the front of the shoulder.  A feeling of warmth in the front of the shoulder.  Limited range of motion of the shoulder and the elbow.  A crackling sound (crepitation) when you move or touch the shoulder or the upper arm. In some cases, symptoms may return after treatment, and they may be long-lasting (chronic). How is this diagnosed? This condition is diagnosed based on:  Your symptoms.  Your medical history.  Physical exam.  X-ray or MRI, if needed. How is this treated? Treatment for this condition depends on the severity of your injury. It may include:  Resting the injured arm.  Icing the injured area.  Doing physical therapy. Your health care provider may also use:  Medicines to treat pain and inflammation.  Sound waves to treat the injured muscle (ultrasound therapy).  Medicines that are injected to the muscle (corticosteroids).  Medicines that numb the area (local anesthetics).  Surgery. This is done if other treatments have not worked. Follow these instructions at home: Managing pain, stiffness, and swelling      If directed, put ice on the injured area. ? Put ice in a plastic bag. ? Place a towel between your skin and the bag. ? Leave the ice on for 20 minutes, 2-3 times a day.  If directed, apply heat to the affected area before you exercise. Use the heat source that your  health care provider recommends, such as a moist heat pack or a heating pad. ? Place a towel between your skin and the heat source. ? Leave the heat on for 20-30 minutes. ? Remove the heat if your skin turns bright red. This is especially important if you are unable to feel pain, heat, or cold. You may have a greater risk of getting burned.  Move your fingers often to reduce stiffness and swelling.  Raise (elevate)  the injured area above the level of your heart while you are lying down. Activity  Do not lift anything that is heavier than 10 lb (4.5 kg), or the limit that you are told, until your health care provider says that it is safe.  Avoid activities that cause pain or make your condition worse.  Return to your normal activities as told by your health care provider. Ask your health care provider what activities are safe for you.  Do exercises as told by your health care provider. General instructions  Take over-the-counter and prescription medicines only as told by your health care provider.  Do not use any products that contain nicotine or tobacco, such as cigarettes, e-cigarettes, and chewing tobacco. These can delay healing. If you need help quitting, ask your health care provider.  Keep all follow-up visits as told by your health care provider. This is important. How is this prevented?  Warm up and stretch before being active.  Cool down and stretch after being active.  Give your body time to rest between periods of activity.  Make sure any equipment that you use is fitted to you.  Be safe and responsible while being active to avoid falls.  Maintain physical fitness, including: ? Strength. ? Flexibility. ? Heart health (cardiovascular fitness). ? The ability to use muscles for a long time (endurance). Contact a health care provider if:  You have symptoms that get worse or do not get better after 2 weeks of treatment.  You develop new symptoms. Get help right away if:  You develop severe pain. Summary  Tendinitis is inflammation of the biceps tendon. Tenosynovitis is inflammation of the lining around the biceps tendon. These conditions often occur at the same time.  These conditions are usually caused by overusing the shoulder joint and biceps muscle.  Symptoms include pain, warmth in the shoulder, and limited range of motion.  The two conditions are treated with rest,  ice, medicines, and surgery (rare). This information is not intended to replace advice given to you by your health care provider. Make sure you discuss any questions you have with your health care provider. Document Revised: 04/28/2018 Document Reviewed: 02/26/2018 [employer] Patient Education  2020 [employer]  Proximal Biceps Tendinitis and Tenosynovitis Rehab Ask your health care provider which exercises are safe for you. Do exercises exactly as told by your health care provider and adjust them as directed. It is normal to feel mild stretching, pulling, tightness, or discomfort as you do these exercises. Stop right away if you feel sudden pain or your pain gets worse. Do not begin these exercises until told by your health care provider. Stretching and range-of-motion exercises These exercises warm up your muscles and joints and improve the movement and flexibility of your arm and shoulder. The exercises also help to relieve pain and stiffness. Forearm rotation 1. Stand or sit with your left / right elbow bent in a 90-degree (right angle). Position your forearm so that the thumb is facing the ceiling (neutral position). 2. Rotate your  palm up until it cannot go any farther. 3. Hold this position for __________ seconds. 4. Rotate your palm down until it cannot go any farther. 5. Hold this position for __________ seconds. Repeat __________ times. Complete this exercise __________ times a day. Elbow range of motion 1. Stand or sit with your left / right elbow bent in a 90-degree angle (right angle). Position your forearm so that the thumb is facing the ceiling (neutral position). 2. Slowly bend your elbow as far as you can until you feel a stretch or cannot go any farther. 3. Hold this position for __________ seconds. 4. Slowly straighten your elbow as far as you can until you feel a stretch or cannot go any farther. 5. Hold this position for __________ seconds. Repeat __________ times.  Complete this exercise __________ times a day. Biceps stretch 1. Stand facing a wall, or stand by a door frame. 2. Raise your left / right arm out to your side, to your shoulder height. Place the thumb side of your hand against the wall. Your palm should be facing the floor (palm down). 3. Keeping your arm straight, rotate your body in the opposite direction of the raised arm until you feel a gentle stretch in your biceps. 4. Hold this position for __________ seconds. 5. Slowly return to the starting position. Repeat __________ times. Complete this exercise __________ times a day. Shoulder pendulum  1. Stand near a table or counter that you can hold onto for balance. 2. Bend forward at the waist and let your left / right arm hang straight down. Use your other arm to support you and help you stay balanced. 3. Relax your left / right arm and shoulder muscles, and move your hips and your trunk so your left / right arm swings freely. Your arm should swing because of the motion of your body, not because you are using your arm or shoulder muscles. 4. Keep moving your hips and trunk so your arm swings in the following directions, as told by your health care provider: ? Side to side. ? Forward and backward. ? In clockwise and counterclockwise circles. Repeat __________ times. Complete this exercise __________ times a day. Shoulder flexion, assisted  1. Stand facing a wall. Put your left / right palm on the wall. 2. Slowly move your left / right hand up the wall (flexion). Stop when you feel a stretch in your shoulder, or when you reach the angle that is recommended by your health care provider. ? Use your other hand to help raise your arm, if needed (assisted). ? As your hand gets higher, you may need to step closer to the wall. ? Avoid shrugging or lifting your shoulder up as you raise your arm. To do this, keep your shoulder blade tucked down toward your spine. 3. Hold this position for __________  seconds. 4. Slowly return to the starting position. Use your other arm to help, if needed. Repeat __________ times. Complete this exercise __________ times a day. Shoulder flexion 1. Stand with your left / right arm hanging down at your side. 2. Keep your arm straight as you lift your arm forward and toward the ceiling (flexion). 3. Hold this position for __________ seconds. 4. Slowly return to the starting position. Repeat __________ times. Complete this exercise __________ times a day. Sleeper stretch, assisted 1. Lie on your left / right side (injured side) with your hips and knees bent and your left / right arm straight in front of you. 2.  Bend your elbow to a 90-degree angle (right angle), so your fingers are pointing to the ceiling. 3. Use your other hand to gently push your arm toward the floor (assisted), stopping when you feel a gentle stretch. ? Keep your shoulder blades lightly squeezed together during the exercise. 4. Hold this position for __________ seconds. 5. Slowly return to the starting position. Repeat __________ times. Complete this exercise __________ times a day. Strengthening exercises These exercises build strength and endurance in your arm and shoulder. Endurance is the ability to use your muscles for a long time, even after they get tired. Biceps curls You can use a weight or an exercise band for this exercise. 1. Sit on a stable chair without armrests, or stand up. 2. Hold a __________ weight in your left / right hand, or hold an exercise band with both hands. Your palms should face up toward the ceiling at the starting position. 3. Bend your left / right elbow and move your hand up toward your shoulder. Keep your other arm straight down, in the starting position. 4. Hold this position for __________ seconds. 5. Slowly return to the starting position. Repeat __________ times. Complete this exercise __________ times a day. Internal shoulder rotation You will use  an exercise band secured to a stable object at waist height for this exercise. A door and doorframe work well. 1. Stand sideways next to a door with your left / right arm closest to the door, holding the exercise band in your hand. 2. With your elbow bent in a 90-degree angle (right angle) and keeping your elbow at your side, bring your hand toward your belly (internal rotation). ? Make sure your wrist is staying straight as you do this exercise. 3. Hold this position for __________ seconds. 4. Slowly return to the starting position. Repeat __________ times. Complete this exercise __________ times a day. External shoulder rotation You will use an exercise band secured to a stable object at waist height for this exercise. A door and doorframe work well. 1. Stand sideways next to a door with your left / right arm away from the door, holding the exercise band in your hand. 2. With your elbow bent in a 90-degree angle (right angle) and keeping your elbow at your side, swing your arm away from your body (external rotation). ? Make sure your wrist is staying straight as you do this exercise. 3. Hold this position for __________ seconds. 4. Slowly return to the starting position. Repeat __________ times. Complete this exercise __________ times a day. External shoulder rotation, side-lying You will use a weight to do this exercise. 1. Lie on your uninjured side with your left / right arm at your side. Bend your elbow to a 90-degree angle (right angle). Hold a __________ weight in your left / right hand. 2. Keeping your elbow at your side, raise your arm toward the ceiling (external rotation). ? Make sure your wrist is staying straight as you do this exercise. 3. Hold this position for __________ seconds. 4. Slowly return to the starting position. Repeat __________ times. Complete this exercise __________ times a day. Scapular retraction Scapular retraction is the process of pulling the shoulder  blades (scapulae) toward each other, and toward the spine. You will need an exercise band to do this exercise. 1. Sit in a stable chair without armrests, or stand up. 2. Secure an exercise band to a stable object in front of you so the band is at shoulder height. 3. Hold one  end of the exercise band in each hand. 4. Squeeze your shoulder blades together and move your elbows slightly behind you (retraction). Do not shrug your shoulders upward while you do this. 5. Hold this position for __________ seconds. 6. Slowly return to the starting position. Repeat __________ times. Complete this exercise __________ times a day. Scapular protraction, supine Scapular protraction is the process of moving your shoulder blades away from each other, and away from the spine, while you lie on your back (supine position). 1. Lie on your back on a firm surface. Hold a __________ weight in your left / right hand. 2. Raise your left / right arm straight into the air so your hand is directly above your shoulder joint. 3. Push the weight into the air so your shoulder (scapula) lifts off the surface that you are lying on. Think of trying to punch the ceiling by only moving your scapula forward (protraction). Do not move your head, neck, or back. 4. Hold this position for __________ seconds. 5. Slowly return to the starting position. Repeat __________ times. Complete this exercise __________ times a day. This information is not intended to replace advice given to you by your health care provider. Make sure you discuss any questions you have with your health care provider. Document Revised: 04/28/2018 Document Reviewed: 01/12/2018 [employer] Patient Education  2020 [employer].
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
## 2019-12-12 ENCOUNTER — Other Ambulatory Visit: Payer: Self-pay | Admitting: Family Medicine

## 2019-12-28 ENCOUNTER — Other Ambulatory Visit: Payer: Self-pay | Admitting: Family Medicine

## 2020-01-05 ENCOUNTER — Other Ambulatory Visit: Payer: Self-pay | Admitting: Family Medicine

## 2020-01-25 ENCOUNTER — Other Ambulatory Visit: Payer: Self-pay | Admitting: Family Medicine

## 2020-01-26 ENCOUNTER — Other Ambulatory Visit: Payer: Self-pay | Admitting: Family Medicine

## 2020-02-19 ENCOUNTER — Other Ambulatory Visit: Payer: Self-pay | Admitting: Family Medicine

## 2020-03-21 ENCOUNTER — Other Ambulatory Visit: Payer: Self-pay | Admitting: Family Medicine

## 2020-03-22 ENCOUNTER — Other Ambulatory Visit: Payer: Self-pay | Admitting: Physical Medicine and Rehabilitation

## 2020-03-22 DIAGNOSIS — M5416 Radiculopathy, lumbar region: Secondary | ICD-10-CM

## 2020-03-24 ENCOUNTER — Ambulatory Visit
Admission: RE | Admit: 2020-03-24 | Discharge: 2020-03-24 | Disposition: A | Discharge status: Home / Self Care | Payer: 59 | Source: Ambulatory Visit | Attending: Physical Medicine and Rehabilitation | Admitting: Physical Medicine and Rehabilitation

## 2020-03-24 ENCOUNTER — Other Ambulatory Visit: Payer: Self-pay

## 2020-03-24 DIAGNOSIS — M5416 Radiculopathy, lumbar region: Secondary | ICD-10-CM

## 2020-03-24 MED ORDER — METHYLPREDNISOLONE ACETATE 40 MG/ML INJ SUSP (RADIOLOG
120.0000 mg | Freq: Once | INTRAMUSCULAR | Status: AC
  Administered 2020-03-24: 120 mg via EPIDURAL

## 2020-03-24 MED ORDER — IOPAMIDOL (ISOVUE-M 200) INJECTION 41%
1.0000 mL | Freq: Once | INTRAMUSCULAR | Status: AC
  Administered 2020-03-24: 1 mL via EPIDURAL

## 2020-05-15 ENCOUNTER — Encounter: Payer: Self-pay | Admitting: Family Medicine

## 2020-05-15 ENCOUNTER — Ambulatory Visit: Payer: 59 | Admitting: Family Medicine

## 2020-05-15 ENCOUNTER — Other Ambulatory Visit: Payer: Self-pay

## 2020-05-15 VITALS — BP 122/74 | HR 72 | Temp 98.0°F | Resp 16 | Ht 71.0 in | Wt 239.6 lb

## 2020-05-15 DIAGNOSIS — R222 Localized swelling, mass and lump, trunk: Secondary | ICD-10-CM
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
## 2020-05-17 ENCOUNTER — Other Ambulatory Visit: Payer: Self-pay | Admitting: Family Medicine

## 2020-05-21 ENCOUNTER — Other Ambulatory Visit: Payer: Self-pay | Admitting: Family Medicine

## 2020-05-31 ENCOUNTER — Encounter: Payer: Self-pay | Admitting: Family Medicine

## 2020-05-31 ENCOUNTER — Other Ambulatory Visit: Payer: Self-pay

## 2020-05-31 ENCOUNTER — Ambulatory Visit: Payer: 59 | Admitting: Family Medicine

## 2020-05-31 VITALS — BP 125/78 | HR 65 | Temp 98.0°F | Resp 16 | Ht 71.0 in | Wt 240.4 lb

## 2020-05-31 DIAGNOSIS — F411 Generalized anxiety disorder: Secondary | ICD-10-CM

## 2020-05-31 DIAGNOSIS — I10 Essential (primary) hypertension: Secondary | ICD-10-CM | POA: Diagnosis not present

## 2020-05-31 DIAGNOSIS — R7303 Prediabetes: Secondary | ICD-10-CM

## 2020-05-31 DIAGNOSIS — Z1211 Encounter for screening for malignant neoplasm of colon: Secondary | ICD-10-CM | POA: Diagnosis not present

## 2020-05-31 DIAGNOSIS — E039 Hypothyroidism, unspecified: Secondary | ICD-10-CM

## 2020-05-31 DIAGNOSIS — F3342 Major depressive disorder, recurrent, in full remission: Secondary | ICD-10-CM

## 2020-05-31 DIAGNOSIS — Z0000 Encounter for general adult medical examination without abnormal findings: Secondary | ICD-10-CM

## 2020-05-31 DIAGNOSIS — Z125 Encounter for screening for malignant neoplasm of prostate: Secondary | ICD-10-CM | POA: Diagnosis not present

## 2020-05-31 MED ORDER — LEVOTHYROXINE SODIUM 88 MCG PO TABS: 88.0000 ug | ORAL_TABLET | Freq: Every day | ORAL | 3 refills | Status: DC

## 2020-05-31 MED ORDER — PANTOPRAZOLE SODIUM 40 MG PO TBEC: DELAYED_RELEASE_TABLET | ORAL | 3 refills | Status: DC

## 2020-05-31 MED ORDER — PAROXETINE HCL 30 MG PO TABS: 30.0000 mg | ORAL_TABLET | Freq: Every day | ORAL | 3 refills | Status: DC

## 2020-05-31 MED ORDER — LISINOPRIL 20 MG PO TABS: 1.0000 | ORAL_TABLET | Freq: Every day | ORAL | 3 refills | Status: DC

## 2020-05-31 NOTE — Patient Instructions (Signed)
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
## 2020-06-01 LAB — CBC WITH DIFFERENTIAL/PLATELET
Basophils Absolute: 0.2 10*3/uL — ABNORMAL HIGH (ref 0.0–0.1)
Basophils Relative: 1.2 % (ref 0.0–3.0)
Eosinophils Absolute: 0.4 10*3/uL (ref 0.0–0.7)
Eosinophils Relative: 3.2 % (ref 0.0–5.0)
HCT: 42 % (ref 39.0–52.0)
Hemoglobin: 14.2 g/dL (ref 13.0–17.0)
Lymphocytes Relative: 28.2 % (ref 12.0–46.0)
Lymphs Abs: 3.6 10*3/uL (ref 0.7–4.0)
MCHC: 33.8 g/dL (ref 30.0–36.0)
MCV: 93.3 fl (ref 78.0–100.0)
Monocytes Absolute: 1.3 10*3/uL — ABNORMAL HIGH (ref 0.1–1.0)
Monocytes Relative: 10.3 % (ref 3.0–12.0)
Neutro Abs: 7.3 10*3/uL (ref 1.4–7.7)
Neutrophils Relative %: 57.1 % (ref 43.0–77.0)
Platelets: 383 10*3/uL (ref 150.0–400.0)
RBC: 4.5 Mil/uL (ref 4.22–5.81)
RDW: 13.3 % (ref 11.5–15.5)
WBC: 12.7 10*3/uL — ABNORMAL HIGH (ref 4.0–10.5)

## 2020-06-01 LAB — LDL CHOLESTEROL, DIRECT: Direct LDL: 145 mg/dL

## 2020-06-01 LAB — HEMOGLOBIN A1C: Hgb A1c MFr Bld: 6.2 % (ref 4.6–6.5)

## 2020-06-01 LAB — LIPID PANEL
Cholesterol: 220 mg/dL — ABNORMAL HIGH (ref 0–200)
HDL: 49.2 mg/dL (ref >39.00)
NonHDL: 170.64
Total CHOL/HDL Ratio: 4
Triglycerides: 249 mg/dL — ABNORMAL HIGH (ref 0.0–149.0)
VLDL: 49.8 mg/dL — ABNORMAL HIGH (ref 0.0–40.0)

## 2020-06-01 LAB — PSA: PSA: 0.32 ng/mL (ref 0.10–4.00)

## 2020-06-01 LAB — TSH: TSH: 1.65 u[IU]/mL (ref 0.35–4.50)

## 2020-06-02 ENCOUNTER — Other Ambulatory Visit: Payer: Self-pay

## 2020-06-02 ENCOUNTER — Encounter: Payer: Self-pay | Admitting: Family Medicine

## 2020-06-02 ENCOUNTER — Telehealth: Payer: Self-pay

## 2020-06-02 DIAGNOSIS — Z0000 Encounter for general adult medical examination without abnormal findings: Secondary | ICD-10-CM

## 2020-06-02 DIAGNOSIS — I10 Essential (primary) hypertension: Secondary | ICD-10-CM

## 2020-06-02 NOTE — Telephone Encounter (Signed)
LVM for pt to call back about labs. Needing pt to come for lab visit. Please assist with scheduling.

## 2020-06-05 ENCOUNTER — Telehealth: Payer: Self-pay

## 2020-06-05 ENCOUNTER — Ambulatory Visit (INDEPENDENT_AMBULATORY_CARE_PROVIDER_SITE_OTHER): Payer: 59

## 2020-06-05 ENCOUNTER — Other Ambulatory Visit: Payer: Self-pay

## 2020-06-05 DIAGNOSIS — Z0000 Encounter for general adult medical examination without abnormal findings: Secondary | ICD-10-CM

## 2020-06-05 DIAGNOSIS — I10 Essential (primary) hypertension: Secondary | ICD-10-CM | POA: Diagnosis not present

## 2020-06-05 NOTE — Telephone Encounter (Signed)
Patient returning Nicholas Mckinney's call.  In review of previous messages, Dr. Milinda Cave stated patient could cancel 6/17 appt and return for follow up in 6 months.  Patient is scheduled for 12/04/20. Appt confirmed and patient is aware. No further follow up is needed at this time.

## 2020-06-05 NOTE — Telephone Encounter (Signed)
LM for pt to return call regarding appt. Appt cancelled

## 2020-06-05 NOTE — Addendum Note (Signed)
Addended by: Paschal Dopp on: 06/05/2020 04:14 PM   Modules accepted: Orders

## 2020-06-05 NOTE — Telephone Encounter (Signed)
F/u in 6 mo is fine (ok to cancel 6/17/ appt)

## 2020-06-05 NOTE — Telephone Encounter (Signed)
Pt came in for recollection of cmet today since lab could not process first tube collected. Does he need to keep 6/17 appt?

## 2020-06-05 NOTE — Telephone Encounter (Signed)
Noted  

## 2020-06-06 LAB — COMPREHENSIVE METABOLIC PANEL
AG Ratio: 1.5 (calc) (ref 1.0–2.5)
ALT: 28 U/L (ref 9–46)
AST: 25 U/L (ref 10–35)
Albumin: 4.5 g/dL (ref 3.6–5.1)
Alkaline phosphatase (APISO): 74 U/L (ref 35–144)
BUN: 21 mg/dL (ref 7–25)
CO2: 24 mmol/L (ref 20–32)
Calcium: 9.6 mg/dL (ref 8.6–10.3)
Chloride: 105 mmol/L (ref 98–110)
Creat: 0.98 mg/dL (ref 0.70–1.33)
Globulin: 3 g/dL (calc) (ref 1.9–3.7)
Glucose, Bld: 117 mg/dL — ABNORMAL HIGH (ref 65–99)
Potassium: 5.4 mmol/L — ABNORMAL HIGH (ref 3.5–5.3)
Sodium: 139 mmol/L (ref 135–146)
Total Bilirubin: 0.6 mg/dL (ref 0.2–1.2)
Total Protein: 7.5 g/dL (ref 6.1–8.1)

## 2020-06-30 ENCOUNTER — Ambulatory Visit: Payer: 59

## 2020-07-15 ENCOUNTER — Telehealth: Payer: 59 | Admitting: Nurse Practitioner

## 2020-07-15 DIAGNOSIS — L739 Follicular disorder, unspecified: Secondary | ICD-10-CM | POA: Diagnosis not present

## 2020-07-15 MED ORDER — DOXYCYCLINE HYCLATE 100 MG PO TABS: 100.0000 mg | ORAL_TABLET | Freq: Two times a day (BID) | ORAL | 0 refills | Status: AC

## 2020-07-15 NOTE — Progress Notes (Signed)
  E Visit for Folliculitis   We are sorry you are not feeling well.  Here is how we plan to help!  Based on what you have shared with me it looks like you have folliculitis.  Folliculitis refers to inflammation of the superficial or deep portio of the hair follicle.  It can be infectious or non-infectious. Various bacteria, fungi, viruses, and parasites can cause infectious folliculis  Based upon what you have shared with me it looks like you have a bacterial follicultits.  Folliculitis is inflammation of the hair follicles that can be caused by a superficial infection of the skin and is treated with an antibiotic. I have prescribed: and Doxycycline 100 mg twice per day for 7 days      HOME CARE: Apply a warm, moist washcloth or compress using a saltwater solution (1 teaspoon of table salt to 2 cups water)  Clean the affected skin twice daily with antibacterial soap. Use clean washcloth and towel each time and do not share with anyone.  Wash these items and clothes that have touched the area with hot soapy water. Protect the skin. If possible avoid shaving.  If you must shave, try an Neurosurgeon.  When done, rinse skin with warm water and apply moisturizer.  GET HELP RIGHT AWAY IF: You have extensive skin involvement or the symptoms return after treatment Symptoms don't go away after treatment. Severe itching that persists. If you rash spreads or swells. If you rash begins to smell. If it blisters and opens or develops a yellow-brown crust. You develop a fever. You have a sore throat. You become short of breath.  MAKE SURE YOU:  Understand these instructions. Will watch your condition. Will get help right away if you are not doing well or get worse. Meds ordered this encounter  Medications   doxycycline (VIBRA-TABS) 100 MG tablet    Sig: Take 1 tablet (100 mg total) by mouth 2 (two) times daily for 7 days.    Dispense:  14 tablet    Refill:  0    I spent approximately 7  minutes reviewing the patient's chart, history and current clinical condition and coordinating his care.   Thank you for choosing an e-visit.  Your e-visit answers were reviewed by a board certified advanced clinical practitioner to complete your personal care plan. Depending upon the condition, your plan could have included both over the counter or prescription medications.  Please review your pharmacy choice. Make sure the pharmacy is open so you can pick up prescription now. If there is a problem, you may contact your provider through [employer] and have the prescription routed to another pharmacy.  Your safety is important to [place]. If you have drug allergies check your prescription carefully.   For the next 24 hours you can use MyChart to ask questions about today's visit, request a non-urgent call back, or ask for a work or school excuse. You will get an email in the next two days asking about your experience. I hope that your e-visit has been valuable and will speed your recovery.

## 2020-07-20 ENCOUNTER — Other Ambulatory Visit: Payer: Self-pay | Admitting: Family Medicine

## 2020-09-16 ENCOUNTER — Other Ambulatory Visit: Payer: Self-pay | Admitting: Family Medicine

## 2020-10-11 ENCOUNTER — Other Ambulatory Visit: Payer: Self-pay | Admitting: Physical Medicine and Rehabilitation

## 2020-10-11 DIAGNOSIS — M5416 Radiculopathy, lumbar region: Secondary | ICD-10-CM

## 2020-10-16 ENCOUNTER — Ambulatory Visit
Admission: RE | Admit: 2020-10-16 | Discharge: 2020-10-16 | Disposition: A | Discharge status: Home / Self Care | Payer: 59 | Source: Ambulatory Visit | Attending: Physical Medicine and Rehabilitation | Admitting: Physical Medicine and Rehabilitation

## 2020-10-16 DIAGNOSIS — M5416 Radiculopathy, lumbar region: Secondary | ICD-10-CM

## 2020-10-16 MED ORDER — METHYLPREDNISOLONE ACETATE 40 MG/ML INJ SUSP (RADIOLOG
80.0000 mg | Freq: Once | INTRAMUSCULAR | Status: AC
  Administered 2020-10-16: 80 mg via EPIDURAL

## 2020-10-16 MED ORDER — IOPAMIDOL (ISOVUE-M 200) INJECTION 41%
1.0000 mL | Freq: Once | INTRAMUSCULAR | Status: AC
  Administered 2020-10-16: 1 mL via EPIDURAL

## 2020-10-16 NOTE — Discharge Instructions (Signed)

## 2020-11-13 DIAGNOSIS — G894 Chronic pain syndrome: Secondary | ICD-10-CM | POA: Insufficient documentation

## 2020-11-18 ENCOUNTER — Other Ambulatory Visit: Payer: Self-pay | Admitting: Family Medicine

## 2020-12-04 ENCOUNTER — Ambulatory Visit: Payer: 59 | Admitting: Family Medicine

## 2020-12-13 ENCOUNTER — Other Ambulatory Visit: Payer: Self-pay

## 2020-12-13 ENCOUNTER — Encounter: Payer: Self-pay | Admitting: Family Medicine

## 2020-12-13 ENCOUNTER — Ambulatory Visit: Payer: 59 | Admitting: Family Medicine

## 2020-12-13 VITALS — BP 125/81 | HR 72 | Temp 98.1°F | Ht 71.0 in | Wt 241.4 lb

## 2020-12-13 DIAGNOSIS — E039 Hypothyroidism, unspecified: Secondary | ICD-10-CM

## 2020-12-13 DIAGNOSIS — R7303 Prediabetes: Secondary | ICD-10-CM

## 2020-12-13 DIAGNOSIS — Z8659 Personal history of other mental and behavioral disorders: Secondary | ICD-10-CM

## 2020-12-13 DIAGNOSIS — Z791 Long term (current) use of non-steroidal anti-inflammatories (NSAID): Secondary | ICD-10-CM

## 2020-12-13 DIAGNOSIS — F411 Generalized anxiety disorder: Secondary | ICD-10-CM

## 2020-12-13 DIAGNOSIS — Z5181 Encounter for therapeutic drug level monitoring: Secondary | ICD-10-CM

## 2020-12-13 DIAGNOSIS — M7918 Myalgia, other site: Secondary | ICD-10-CM

## 2020-12-13 DIAGNOSIS — I10 Essential (primary) hypertension: Secondary | ICD-10-CM

## 2020-12-13 MED ORDER — DICLOFENAC SODIUM ER 100 MG PO TB24: 100.0000 mg | ORAL_TABLET | Freq: Every day | ORAL | 11 refills | Status: DC
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
## 2020-12-14 LAB — BASIC METABOLIC PANEL
BUN: 19 mg/dL (ref 6–23)
CO2: 27 mEq/L (ref 19–32)
Calcium: 9.7 mg/dL (ref 8.4–10.5)
Chloride: 102 mEq/L (ref 96–112)
Creatinine, Ser: 1 mg/dL (ref 0.40–1.50)
GFR: 87.25 mL/min (ref >60.00)
Glucose, Bld: 85 mg/dL (ref 70–99)
Potassium: 4.4 mEq/L (ref 3.5–5.1)
Sodium: 136 mEq/L (ref 135–145)

## 2020-12-14 LAB — HEMOGLOBIN A1C: Hgb A1c MFr Bld: 6.3 % (ref 4.6–6.5)

## 2021-02-12 ENCOUNTER — Other Ambulatory Visit: Payer: Self-pay

## 2021-02-13 ENCOUNTER — Encounter: Payer: Self-pay | Admitting: Family Medicine

## 2021-02-13 ENCOUNTER — Ambulatory Visit: Payer: 59 | Admitting: Family Medicine

## 2021-02-13 VITALS — BP 122/73 | HR 73 | Temp 98.0°F | Ht 71.0 in | Wt 240.5 lb

## 2021-02-13 DIAGNOSIS — J209 Acute bronchitis, unspecified: Secondary | ICD-10-CM

## 2021-02-13 DIAGNOSIS — J069 Acute upper respiratory infection, unspecified: Secondary | ICD-10-CM | POA: Diagnosis not present

## 2021-02-13 MED ORDER — PREDNISONE 20 MG PO TABS: ORAL_TABLET | ORAL | 0 refills | Status: DC

## 2021-02-13 MED ORDER — DOXYCYCLINE HYCLATE 100 MG PO CAPS: 100.0000 mg | ORAL_CAPSULE | Freq: Two times a day (BID) | ORAL | 0 refills | Status: AC

## 2021-02-13 MED ORDER — BENZONATATE 200 MG PO CAPS: ORAL_CAPSULE | ORAL | 0 refills | Status: DC
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
## 2021-02-26 ENCOUNTER — Other Ambulatory Visit: Payer: Self-pay

## 2021-02-27 ENCOUNTER — Ambulatory Visit: Payer: 59 | Admitting: Family Medicine

## 2021-02-27 ENCOUNTER — Encounter: Payer: Self-pay | Admitting: Family Medicine

## 2021-02-27 ENCOUNTER — Ambulatory Visit (HOSPITAL_BASED_OUTPATIENT_CLINIC_OR_DEPARTMENT_OTHER)
Admission: RE | Admit: 2021-02-27 | Discharge: 2021-02-27 | Disposition: A | Discharge status: Home / Self Care | Payer: 59 | Source: Ambulatory Visit | Attending: Family Medicine | Admitting: Family Medicine

## 2021-02-27 VITALS — BP 145/68 | HR 87 | Temp 98.4°F | Ht 71.0 in | Wt 233.0 lb

## 2021-02-27 DIAGNOSIS — R062 Wheezing: Secondary | ICD-10-CM

## 2021-02-27 DIAGNOSIS — R5383 Other fatigue: Secondary | ICD-10-CM

## 2021-02-27 DIAGNOSIS — J069 Acute upper respiratory infection, unspecified: Secondary | ICD-10-CM | POA: Diagnosis not present

## 2021-02-27 DIAGNOSIS — J180 Bronchopneumonia, unspecified organism: Secondary | ICD-10-CM | POA: Insufficient documentation

## 2021-02-27 LAB — CBC WITH DIFFERENTIAL/PLATELET
Basophils Absolute: 0 10*3/uL (ref 0.0–0.1)
Basophils Relative: 0.3 % (ref 0.0–3.0)
Eosinophils Absolute: 0.5 10*3/uL (ref 0.0–0.7)
Eosinophils Relative: 5.7 % — ABNORMAL HIGH (ref 0.0–5.0)
HCT: 46.8 % (ref 39.0–52.0)
Hemoglobin: 15.7 g/dL (ref 13.0–17.0)
Lymphocytes Relative: 35.2 % (ref 12.0–46.0)
Lymphs Abs: 3 10*3/uL (ref 0.7–4.0)
MCHC: 33.5 g/dL (ref 30.0–36.0)
MCV: 93.6 fl (ref 78.0–100.0)
Monocytes Absolute: 0.9 10*3/uL (ref 0.1–1.0)
Monocytes Relative: 10.5 % (ref 3.0–12.0)
Neutro Abs: 4.1 10*3/uL (ref 1.4–7.7)
Neutrophils Relative %: 48.3 % (ref 43.0–77.0)
Platelets: 385 10*3/uL (ref 150.0–400.0)
RBC: 5 Mil/uL (ref 4.22–5.81)
RDW: 13.1 % (ref 11.5–15.5)
WBC: 8.6 10*3/uL (ref 4.0–10.5)

## 2021-02-27 MED ORDER — METHYLPREDNISOLONE ACETATE 80 MG/ML IJ SUSP
80.0000 mg | Freq: Once | INTRAMUSCULAR | Status: AC
  Administered 2021-02-27: 80 mg via INTRAMUSCULAR

## 2021-02-27 MED ORDER — LEVOFLOXACIN 500 MG PO TABS: 500.0000 mg | ORAL_TABLET | Freq: Every day | ORAL | 0 refills | Status: AC

## 2021-02-27 MED ORDER — ALBUTEROL SULFATE HFA 108 (90 BASE) MCG/ACT IN AERS: 2.0000 | INHALATION_SPRAY | Freq: Four times a day (QID) | RESPIRATORY_TRACT | 1 refills | Status: DC | PRN

## 2021-02-27 MED ORDER — HYDROCODONE BIT-HOMATROP MBR 5-1.5 MG/5ML PO SOLN: ORAL | 0 refills | Status: DC

## 2021-02-27 MED ORDER — PREDNISONE 20 MG PO TABS: ORAL_TABLET | ORAL | 0 refills | Status: DC
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
## 2021-02-27 NOTE — Addendum Note (Signed)
Addended by: Emi Holes D on: 02/27/2021 09:47 AM   Modules accepted: Orders

## 2021-02-28 LAB — BASIC METABOLIC PANEL
BUN: 28 mg/dL — ABNORMAL HIGH (ref 6–23)
CO2: 26 mEq/L (ref 19–32)
Calcium: 10.1 mg/dL (ref 8.4–10.5)
Chloride: 102 mEq/L (ref 96–112)
Creatinine, Ser: 1.04 mg/dL (ref 0.40–1.50)
GFR: 83.12 mL/min (ref >60.00)
Glucose, Bld: 108 mg/dL — ABNORMAL HIGH (ref 70–99)
Potassium: 5 mEq/L (ref 3.5–5.1)
Sodium: 136 mEq/L (ref 135–145)

## 2021-03-01 ENCOUNTER — Other Ambulatory Visit: Payer: Self-pay

## 2021-03-02 ENCOUNTER — Other Ambulatory Visit: Payer: Self-pay

## 2021-03-02 ENCOUNTER — Ambulatory Visit: Payer: 59 | Admitting: Family Medicine

## 2021-03-02 ENCOUNTER — Encounter: Payer: Self-pay | Admitting: Family Medicine

## 2021-03-02 VITALS — BP 123/83 | HR 85 | Temp 98.7°F | Ht 71.0 in | Wt 234.2 lb

## 2021-03-02 DIAGNOSIS — J180 Bronchopneumonia, unspecified organism: Secondary | ICD-10-CM | POA: Diagnosis not present
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
## 2021-03-02 NOTE — Patient Instructions (Signed)
When finished with hydrocodone cough syrup, start mucinex dm over the counter as directed on packaging.

## 2021-04-24 ENCOUNTER — Other Ambulatory Visit: Payer: Self-pay | Admitting: Family Medicine

## 2021-05-18 ENCOUNTER — Ambulatory Visit: Payer: 59 | Admitting: Family Medicine

## 2021-05-18 ENCOUNTER — Encounter: Payer: Self-pay | Admitting: Family Medicine

## 2021-05-18 VITALS — BP 127/75 | HR 72 | Temp 98.2°F | Ht 71.0 in | Wt 236.4 lb

## 2021-05-18 DIAGNOSIS — M5417 Radiculopathy, lumbosacral region: Secondary | ICD-10-CM | POA: Diagnosis not present

## 2021-05-18 DIAGNOSIS — M47816 Spondylosis without myelopathy or radiculopathy, lumbar region: Secondary | ICD-10-CM | POA: Diagnosis not present

## 2021-05-18 NOTE — Patient Instructions (Signed)
[facility] ?[address] ?[address] ?[place], [place] [address] ? ?Phone: [phone] ?Fax: [phone] ?
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
## 2021-05-21 ENCOUNTER — Other Ambulatory Visit: Payer: Self-pay | Admitting: Family Medicine

## 2021-05-22 ENCOUNTER — Other Ambulatory Visit: Payer: Self-pay | Admitting: Family Medicine

## 2021-05-24 ENCOUNTER — Telehealth: Payer: Self-pay

## 2021-05-24 NOTE — Telephone Encounter (Signed)
Good morning, ? ?You recently faxed a referral on 5/9 to [place] [facility] [place]. They are requesting the referral to be re-faxed with patient's demographics, insurance and recent office visit note from 5/5. Please resubmit with requested information. ?

## 2021-05-24 NOTE — Telephone Encounter (Signed)
Noted  

## 2021-05-29 ENCOUNTER — Other Ambulatory Visit: Payer: Self-pay | Admitting: Student in an Organized Health Care Education/Training Program

## 2021-05-29 DIAGNOSIS — M5116 Intervertebral disc disorders with radiculopathy, lumbar region: Secondary | ICD-10-CM

## 2021-06-09 ENCOUNTER — Ambulatory Visit
Admission: RE | Admit: 2021-06-09 | Discharge: 2021-06-09 | Disposition: A | Discharge status: Home / Self Care | Payer: 59 | Source: Ambulatory Visit | Attending: Student in an Organized Health Care Education/Training Program | Admitting: Student in an Organized Health Care Education/Training Program

## 2021-06-09 DIAGNOSIS — M5116 Intervertebral disc disorders with radiculopathy, lumbar region: Secondary | ICD-10-CM

## 2021-06-16 ENCOUNTER — Other Ambulatory Visit: Payer: Self-pay | Admitting: Family Medicine

## 2021-06-20 ENCOUNTER — Other Ambulatory Visit: Payer: Self-pay | Admitting: Family Medicine

## 2021-06-21 ENCOUNTER — Other Ambulatory Visit: Payer: Self-pay | Admitting: Family Medicine

## 2021-07-14 DIAGNOSIS — R310 Gross hematuria: Secondary | ICD-10-CM

## 2021-07-14 HISTORY — DX: Gross hematuria: R31.0

## 2021-07-18 ENCOUNTER — Other Ambulatory Visit: Payer: Self-pay | Admitting: Family Medicine

## 2021-07-23 ENCOUNTER — Other Ambulatory Visit: Payer: Self-pay | Admitting: Family Medicine

## 2021-08-01 ENCOUNTER — Other Ambulatory Visit: Payer: Self-pay

## 2021-08-01 ENCOUNTER — Encounter (HOSPITAL_BASED_OUTPATIENT_CLINIC_OR_DEPARTMENT_OTHER): Payer: Self-pay | Admitting: Emergency Medicine

## 2021-08-01 ENCOUNTER — Emergency Department (HOSPITAL_BASED_OUTPATIENT_CLINIC_OR_DEPARTMENT_OTHER): Payer: 59

## 2021-08-01 ENCOUNTER — Emergency Department (HOSPITAL_BASED_OUTPATIENT_CLINIC_OR_DEPARTMENT_OTHER)
Admission: EM | Admit: 2021-08-01 | Discharge: 2021-08-01 | Discharge status: Against Medical Advice | Payer: 59 | Attending: Emergency Medicine | Admitting: Emergency Medicine

## 2021-08-01 DIAGNOSIS — R1031 Right lower quadrant pain: Secondary | ICD-10-CM | POA: Insufficient documentation

## 2021-08-01 DIAGNOSIS — R319 Hematuria, unspecified: Secondary | ICD-10-CM | POA: Insufficient documentation

## 2021-08-01 DIAGNOSIS — R109 Unspecified abdominal pain: Secondary | ICD-10-CM | POA: Diagnosis present

## 2021-08-01 LAB — URINALYSIS, ROUTINE W REFLEX MICROSCOPIC
Bilirubin Urine: NEGATIVE
Glucose, UA: NEGATIVE mg/dL
Ketones, ur: NEGATIVE mg/dL
Leukocytes,Ua: NEGATIVE
Nitrite: NEGATIVE
Protein, ur: NEGATIVE mg/dL
Specific Gravity, Urine: 1.01 (ref 1.005–1.030)
pH: 5.5 (ref 5.0–8.0)

## 2021-08-01 LAB — COMPREHENSIVE METABOLIC PANEL
ALT: 23 U/L (ref 0–44)
AST: 25 U/L (ref 15–41)
Albumin: 3.7 g/dL (ref 3.5–5.0)
Alkaline Phosphatase: 63 U/L (ref 38–126)
Anion gap: 5 (ref 5–15)
BUN: 16 mg/dL (ref 6–20)
CO2: 25 mmol/L (ref 22–32)
Calcium: 9 mg/dL (ref 8.9–10.3)
Chloride: 106 mmol/L (ref 98–111)
Creatinine, Ser: 1.16 mg/dL (ref 0.61–1.24)
GFR, Estimated: >60 mL/min (ref >60)
Glucose, Bld: 114 mg/dL — ABNORMAL HIGH (ref 70–99)
Potassium: 4.1 mmol/L (ref 3.5–5.1)
Sodium: 136 mmol/L (ref 135–145)
Total Bilirubin: 0.8 mg/dL (ref 0.3–1.2)
Total Protein: 7.4 g/dL (ref 6.5–8.1)

## 2021-08-01 LAB — CBC
HCT: 40.1 % (ref 39.0–52.0)
Hemoglobin: 13.7 g/dL (ref 13.0–17.0)
MCH: 31.6 pg (ref 26.0–34.0)
MCHC: 34.2 g/dL (ref 30.0–36.0)
MCV: 92.4 fL (ref 80.0–100.0)
Platelets: 395 10*3/uL (ref 150–400)
RBC: 4.34 MIL/uL (ref 4.22–5.81)
RDW: 12.6 % (ref 11.5–15.5)
WBC: 10.6 10*3/uL — ABNORMAL HIGH (ref 4.0–10.5)
nRBC: 0 % (ref 0.0–0.2)

## 2021-08-01 LAB — URINALYSIS, MICROSCOPIC (REFLEX)

## 2021-08-01 MED ORDER — IOHEXOL 300 MG/ML  SOLN
100.0000 mL | Freq: Once | INTRAMUSCULAR | Status: AC | PRN
  Administered 2021-08-01: 100 mL via INTRAVENOUS

## 2021-08-01 NOTE — ED Notes (Signed)
Bladder scan 204 ml °

## 2021-08-01 NOTE — ED Provider Notes (Signed)
[facility] EMERGENCY DEPARTMENT Provider Note   CSN: [id number] Arrival date & time: 08/01/21  1222     History  Chief Complaint  Patient presents with   Hematuria    Nicholas Mckinney is a 51 y.o. male.  Patient with history of diverticulitis and chronic low back pain presents today with complaints of abdominal pain. He states that same began gradually a few days ago and is located in his right lower quadrant and does not radiate. He states that today he urinated bright red blood and after that felt like he couldn't urinate and presented today for same. Denies any clots in the urine. Denies any fevers, chills, nausea, vomiting, or diarrhea. Denies any history of kidney stones. No history of abdominal surgeries.  The history is provided by the patient. No language interpreter was used.  Hematuria Associated symptoms include abdominal pain.       Home Medications Prior to Admission medications   Medication Sig Start Date End Date Taking? Authorizing Provider  albuterol (VENTOLIN HFA) 108 (90 Base) MCG/ACT inhaler TAKE 2 PUFFS BY MOUTH EVERY 6 HOURS AS NEEDED FOR WHEEZE OR SHORTNESS OF BREATH 07/24/21   [name], Maryjean Morn, MD  Diclofenac Sodium CR 100 MG 24 hr tablet Take 1 tablet (100 mg total) by mouth daily. 12/13/20   [name], Maryjean Morn, MD  HYDROcodone-acetaminophen (NORCO) 10-325 MG tablet Take 1 tablet by mouth every 6 (six) hours as needed. 05/16/21   [provider]  ipratropium (ATROVENT) 0.03 % nasal spray Place into both nostrils. 07/06/20   [provider]  levothyroxine (SYNTHROID) 88 MCG tablet TAKE 1 TABLET BY MOUTH EVERY DAY 07/18/21   [name], Maryjean Morn, MD  lisinopril (ZESTRIL) 20 MG tablet TAKE 1 TABLET BY MOUTH EVERY DAY 07/18/21   [name], Maryjean Morn, MD  pantoprazole (PROTONIX) 40 MG tablet TAKE 1 TABLET (40 MG TOTAL) BY MOUTH DAILY. *OFFICE VISIT NEEDED* 06/20/21   [name], Maryjean Morn, MD  PARoxetine (PAXIL) 30 MG tablet TAKE 1 TABLET BY MOUTH EVERY  DAY 06/20/21   [name], Maryjean Morn, MD      Allergies    Duloxetine, Erythromycin, and Penicillins    Review of Systems   Review of Systems  Gastrointestinal:  Positive for abdominal pain.  Genitourinary:  Positive for hematuria.  All other systems reviewed and are negative.   Physical Exam Updated Vital Signs BP 131/75   Pulse 71   Temp 99.4 F (37.4 C) (Oral)   Resp 16   Ht 5\' 11"  (1.803 m)   Wt 105.7 kg   SpO2 96%   BMI 32.50 kg/m  Physical Exam Vitals and nursing note reviewed.  Constitutional:      General: He is not in acute distress.    Appearance: Normal appearance. He is normal weight. He is not ill-appearing, toxic-appearing or diaphoretic.  HENT:     Head: Normocephalic and atraumatic.  Cardiovascular:     Rate and Rhythm: Normal rate.  Pulmonary:     Effort: Pulmonary effort is normal. No respiratory distress.  Abdominal:     General: Abdomen is flat.     Palpations: Abdomen is soft.     Tenderness: There is abdominal tenderness in the right lower quadrant and suprapubic area.  Musculoskeletal:        General: Normal range of motion.     Cervical back: Normal range of motion.  Skin:    General: Skin is warm and dry.  Neurological:     General: No  focal deficit present.     Mental Status: He is alert.  Psychiatric:        Mood and Affect: Mood normal.        Behavior: Behavior normal.     ED Results / Procedures / Treatments   Labs (all labs ordered are listed, but only abnormal results are displayed) Labs Reviewed  URINALYSIS, ROUTINE W REFLEX MICROSCOPIC - Abnormal; Notable for the following components:      Result Value   Hgb urine dipstick TRACE (*)    All other components within normal limits  URINALYSIS, MICROSCOPIC (REFLEX) - Abnormal; Notable for the following components:   Bacteria, UA RARE (*)    All other components within normal limits  CBC - Abnormal; Notable for the following components:   WBC 10.6 (*)    All other components  within normal limits  COMPREHENSIVE METABOLIC PANEL - Abnormal; Notable for the following components:   Glucose, Bld 114 (*)    All other components within normal limits    EKG None  Radiology No results found.  Procedures Procedures    Medications Ordered in ED Medications  iohexol (OMNIPAQUE) 300 MG/ML solution 100 mL (100 mLs Intravenous Contrast Given 08/01/21 1712)    ED Course/ Medical Decision Making/ A&P                           Medical Decision Making Amount and/or Complexity of Data Reviewed Labs: ordered. Radiology: ordered.  Risk Prescription drug management.   This patient presents to the ED for concern of abdominal pain and hematuria, this involves an extensive number of treatment options, and is a complaint that carries with it a high risk of complications and morbidity.   Co morbidities that complicate the patient evaluation  Hx diverticulitis   Lab Tests:  I Ordered, and personally interpreted labs.  The pertinent results include:  WBC 10.6 and trace hematuria. No other acute laboratory findings   Imaging Studies ordered:  I ordered imaging studies including CT abdomen pelvis with contrast  I independently visualized and interpreted imaging which showed  1. No acute intra-abdominal process. No urolithiasis. 2. Subtle peribronchovascular ground-glass densities in the right middle and lower lobes are likely inflammatory or infectious. I agree with the radiologist interpretation   Test / Admission - Considered:  Patient presents today with complaints of abdominal pain and hematuria. He is afebrile, non-toxic appearing, and in no acute distress with reassuring vital signs. UA reveals trace hematuria. Given his complaint os difficulty urinating, bladder scan performed which showed 200 ml. Patient subsequently voided and post void scan showed 0 ml. Given patients continued pain with RLQ tenderness to palpation, CT imaging ordered for further  evaluation of same.  When patient got back from CT, he told staff that he was going to leave. Nursing staff informed the patient that given his CT results were not back yet, he would have to sign out AMA. He subsequently signed and left before I was able to talk to him or discuss his test results.    Final Clinical Impression(s) / ED Diagnoses Final diagnoses:  Hematuria, unspecified type  Right lower quadrant abdominal pain    Rx / DC Orders ED Discharge Orders     None         Vear Clock 08/01/21 2031    Virgina Norfolk, DO 08/01/21 2146

## 2021-08-01 NOTE — ED Notes (Signed)
Client stated he had to leave, was unable to stay due to needs at home, stated he would just get his results later. Encouraged client to remain in the ED, ED PA aware of pts' desire to leave. Client was removing IV himself, another RN in to apply bandage, again encouraged client to remain so that we could provide a proper examination and treatment for his complaint. Pt left. ED MD aware and Charge RN informed

## 2021-08-01 NOTE — ED Triage Notes (Signed)
Pt reports hematuria when he voided at 1100 today; sts now he feels like he needs to urinate, but can't

## 2021-08-01 NOTE — ED Notes (Signed)
Patient transported to CT 

## 2021-08-02 ENCOUNTER — Encounter: Payer: Self-pay | Admitting: Family Medicine

## 2021-08-02 ENCOUNTER — Telehealth: Payer: Self-pay

## 2021-08-02 NOTE — Telephone Encounter (Signed)
Pt states that he has had some coughing for a while but he thought it was because of his blood pressure medication.

## 2021-08-02 NOTE — Telephone Encounter (Signed)
Pt had CT abd pelvis completed yesterday.  Please review and advise

## 2021-08-02 NOTE — Telephone Encounter (Signed)
Yes his lisinopril can cause a dry cough with a feeling of a tickle in the back of the throat. If this is what he is having then I think it is fine and we do not need to worry about infection or inflammation. If he is not sure then encourage him to come in to see me.

## 2021-08-02 NOTE — Telephone Encounter (Signed)
Patient returning Britt's call regarding test results. Please try to call back before 3:30pm

## 2021-08-02 NOTE — Telephone Encounter (Signed)
I reviewed his CT scan. There is nothing alarming at all. There are a few subtle areas in his lungs that look like nonspecific inflammation, likely of no clinical significance. Is he having any significant coughing or feeling short of breath lately?   Regarding the blood in his urine, I recommend he follow up with me to discuss. -thx

## 2021-08-02 NOTE — Telephone Encounter (Signed)
LM for pt to return call regarding results.  

## 2021-08-02 NOTE — Telephone Encounter (Signed)
Patient calling regarding CT results.  Please call when Dr. Milinda Cave is able to review scan. Please leave detailed message on patient's cell [phone].

## 2021-08-03 NOTE — Telephone Encounter (Signed)
Spoke with patient regarding results/recommendations.  

## 2021-08-07 ENCOUNTER — Other Ambulatory Visit: Payer: Self-pay | Admitting: Family Medicine

## 2021-08-08 ENCOUNTER — Encounter: Payer: Self-pay | Admitting: Family Medicine

## 2021-08-08 ENCOUNTER — Ambulatory Visit: Payer: 59 | Admitting: Family Medicine

## 2021-08-08 VITALS — BP 140/92 | HR 73 | Temp 98.2°F | Ht 71.0 in | Wt 235.4 lb

## 2021-08-08 DIAGNOSIS — R918 Other nonspecific abnormal finding of lung field: Secondary | ICD-10-CM

## 2021-08-08 DIAGNOSIS — J45901 Unspecified asthma with (acute) exacerbation: Secondary | ICD-10-CM | POA: Diagnosis not present

## 2021-08-08 DIAGNOSIS — R310 Gross hematuria: Secondary | ICD-10-CM | POA: Diagnosis not present

## 2021-08-08 LAB — POCT URINALYSIS DIPSTICK
Bilirubin, UA: NEGATIVE
Blood, UA: POSITIVE
Glucose, UA: NEGATIVE
Ketones, UA: NEGATIVE
Leukocytes, UA: NEGATIVE
Nitrite, UA: NEGATIVE
Protein, UA: NEGATIVE
Spec Grav, UA: 1.015 (ref 1.010–1.025)
Urobilinogen, UA: 0.2 E.U./dL
pH, UA: 6 (ref 5.0–8.0)

## 2021-08-08 MED ORDER — ALBUTEROL SULFATE HFA 108 (90 BASE) MCG/ACT IN AERS: INHALATION_SPRAY | RESPIRATORY_TRACT | 0 refills | Status: DC

## 2021-08-08 MED ORDER — PREDNISONE 20 MG PO TABS: ORAL_TABLET | ORAL | 0 refills | Status: DC

## 2021-08-08 MED ORDER — LEVOFLOXACIN 500 MG PO TABS: 500.0000 mg | ORAL_TABLET | Freq: Every day | ORAL | 0 refills | Status: AC
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
## 2021-08-09 LAB — URINE CULTURE
MICRO NUMBER:: 13698731
SPECIMEN QUALITY:: ADEQUATE

## 2021-08-09 LAB — URINALYSIS, ROUTINE W REFLEX MICROSCOPIC
Bilirubin Urine: NEGATIVE
Hgb urine dipstick: NEGATIVE
Ketones, ur: NEGATIVE
Leukocytes,Ua: NEGATIVE
Nitrite: NEGATIVE
RBC / HPF: NONE SEEN (ref 0–?)
Specific Gravity, Urine: 1.015 (ref 1.000–1.030)
Total Protein, Urine: NEGATIVE
Urine Glucose: NEGATIVE
Urobilinogen, UA: 0.2 (ref 0.0–1.0)
WBC, UA: NONE SEEN (ref 0–?)
pH: 6 (ref 5.0–8.0)

## 2021-08-18 ENCOUNTER — Other Ambulatory Visit: Payer: Self-pay | Admitting: Family Medicine

## 2021-08-20 NOTE — Telephone Encounter (Signed)
Pt has upcoming appt on 8/9

## 2021-08-22 ENCOUNTER — Encounter: Payer: Self-pay | Admitting: Family Medicine

## 2021-08-22 ENCOUNTER — Ambulatory Visit (INDEPENDENT_AMBULATORY_CARE_PROVIDER_SITE_OTHER): Payer: 59 | Admitting: Family Medicine

## 2021-08-22 VITALS — BP 118/73 | HR 74 | Temp 98.5°F | Ht 71.0 in | Wt 234.8 lb

## 2021-08-22 DIAGNOSIS — E782 Mixed hyperlipidemia: Secondary | ICD-10-CM

## 2021-08-22 DIAGNOSIS — Z125 Encounter for screening for malignant neoplasm of prostate: Secondary | ICD-10-CM | POA: Diagnosis not present

## 2021-08-22 DIAGNOSIS — R7303 Prediabetes: Secondary | ICD-10-CM

## 2021-08-22 DIAGNOSIS — E039 Hypothyroidism, unspecified: Secondary | ICD-10-CM

## 2021-08-22 DIAGNOSIS — R310 Gross hematuria: Secondary | ICD-10-CM

## 2021-08-22 DIAGNOSIS — Z0000 Encounter for general adult medical examination without abnormal findings: Secondary | ICD-10-CM | POA: Diagnosis not present

## 2021-08-22 DIAGNOSIS — I10 Essential (primary) hypertension: Secondary | ICD-10-CM | POA: Diagnosis not present

## 2021-08-22 DIAGNOSIS — J209 Acute bronchitis, unspecified: Secondary | ICD-10-CM

## 2021-08-22 LAB — POCT GLYCOSYLATED HEMOGLOBIN (HGB A1C)
HbA1c POC (<> result, manual entry): 5.7 % (ref 4.0–5.6)
HbA1c, POC (controlled diabetic range): 5.7 % (ref 0.0–7.0)
HbA1c, POC (prediabetic range): 5.7 % (ref 5.7–6.4)
Hemoglobin A1C: 5.7 % — AB (ref 4.0–5.6)

## 2021-08-22 MED ORDER — LISINOPRIL 20 MG PO TABS
20.0000 mg | ORAL_TABLET | Freq: Every day | ORAL | 3 refills | Status: DC
Start: 2021-08-22 — End: 2022-08-28

## 2021-08-22 MED ORDER — LEVOTHYROXINE SODIUM 88 MCG PO TABS
88.0000 ug | ORAL_TABLET | Freq: Every day | ORAL | 3 refills | Status: DC
Start: 2021-08-22 — End: 2022-08-16

## 2021-08-22 NOTE — Patient Instructions (Addendum)
[facility] Address: [address] [place], [place], [place] [address] Phone: [phone]   Health Maintenance, Male Adopting a healthy lifestyle and getting preventive care are important in promoting health and wellness. Ask your health care provider about: The right schedule for you to have regular tests and exams. Things you can do on your own to prevent diseases and keep yourself healthy. What should I know about diet, weight, and exercise? Eat a healthy diet  Eat a diet that includes plenty of vegetables, fruits, low-fat dairy products, and lean protein. Do not eat a lot of foods that are high in solid fats, added sugars, or sodium. Maintain a healthy weight Body mass index (BMI) is a measurement that can be used to identify possible weight problems. It estimates body fat based on height and weight. Your health care provider can help determine your BMI and help you achieve or maintain a healthy weight. Get regular exercise Get regular exercise. This is one of the most important things you can do for your health. Most adults should: Exercise for at least 150 minutes each week. The exercise should increase your heart rate and make you sweat (moderate-intensity exercise). Do strengthening exercises at least twice a week. This is in addition to the moderate-intensity exercise. Spend less time sitting. Even light physical activity can be beneficial. Watch cholesterol and blood lipids Have your blood tested for lipids and cholesterol at 52 years of age, then have this test every 5 years. You may need to have your cholesterol levels checked more often if: Your lipid or cholesterol levels are high. You are older than 52 years of age. You are at high risk for heart disease. What should I know about cancer screening? Many types of cancers can be detected early and may often be prevented. Depending on your health history and family history, you may need to have cancer screening at various ages. This  may include screening for: Colorectal cancer. Prostate cancer. Skin cancer. Lung cancer. What should I know about heart disease, diabetes, and high blood pressure? Blood pressure and heart disease High blood pressure causes heart disease and increases the risk of stroke. This is more likely to develop in people who have high blood pressure readings or are overweight. Talk with your health care provider about your target blood pressure readings. Have your blood pressure checked: Every 3-5 years if you are 83-17 years of age. Every year if you are 59 years old or older. If you are between the ages of 36 and 40 and are a current or former smoker, ask your health care provider if you should have a one-time screening for abdominal aortic aneurysm (AAA). Diabetes Have regular diabetes screenings. This checks your fasting blood sugar level. Have the screening done: Once every three years after age 88 if you are at a normal weight and have a low risk for diabetes. More often and at a younger age if you are overweight or have a high risk for diabetes. What should I know about preventing infection? Hepatitis B If you have a higher risk for hepatitis B, you should be screened for this virus. Talk with your health care provider to find out if you are at risk for hepatitis B infection. Hepatitis C Blood testing is recommended for: Everyone born from [birth year] through 1966. Anyone with known risk factors for hepatitis C. Sexually transmitted infections (STIs) You should be screened each year for STIs, including gonorrhea and chlamydia, if: You are sexually active and are younger  than 52 years of age. You are older than 52 years of age and your health care provider tells you that you are at risk for this type of infection. Your sexual activity has changed since you were last screened, and you are at increased risk for chlamydia or gonorrhea. Ask your health care provider if you are at risk. Ask your health  care provider about whether you are at high risk for HIV. Your health care provider may recommend a prescription medicine to help prevent HIV infection. If you choose to take medicine to prevent HIV, you should first get tested for HIV. You should then be tested every 3 months for as long as you are taking the medicine. Follow these instructions at home: Alcohol use Do not drink alcohol if your health care provider tells you not to drink. If you drink alcohol: Limit how much you have to 0-2 drinks a day. Know how much alcohol is in your drink. In the [place] one drink equals one 12 oz bottle of beer (355 mL), one 5 oz glass of wine (148 mL), or one 1 oz glass of hard liquor (44 mL). Lifestyle Do not use any products that contain nicotine or tobacco. These products include cigarettes, chewing tobacco, and vaping devices, such as e-cigarettes. If you need help quitting, ask your health care provider. Do not use street drugs. Do not share needles. Ask your health care provider for help if you need support or information about quitting drugs. General instructions Schedule regular health, dental, and eye exams. Stay current with your vaccines. Tell your health care provider if: You often feel depressed. You have ever been abused or do not feel safe at home. Summary Adopting a healthy lifestyle and getting preventive care are important in promoting health and wellness. Follow your health care provider's instructions about healthy diet, exercising, and getting tested or screened for diseases. Follow your health care provider's instructions on monitoring your cholesterol and blood pressure. This information is not intended to replace advice given to you by your health care provider. Make sure you discuss any questions you have with your health care provider. Document Revised: 05/22/2020 Document Reviewed: 05/22/2020 Elsevier Patient Education  [place].
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
## 2021-08-22 NOTE — Addendum Note (Signed)
Addended by: Emi Holes D on: 08/22/2021 04:18 PM   Modules accepted: Orders

## 2021-08-23 LAB — TSH: TSH: 0.87 u[IU]/mL (ref 0.35–5.50)

## 2021-08-23 LAB — LIPID PANEL
Cholesterol: 253 mg/dL — ABNORMAL HIGH (ref 0–200)
HDL: 63.1 mg/dL (ref >39.00)
LDL Cholesterol: 151 mg/dL — ABNORMAL HIGH (ref 0–99)
NonHDL: 189.6
Total CHOL/HDL Ratio: 4
Triglycerides: 194 mg/dL — ABNORMAL HIGH (ref 0.0–149.0)
VLDL: 38.8 mg/dL (ref 0.0–40.0)

## 2021-08-23 LAB — PSA: PSA: 0.2 ng/mL (ref 0.10–4.00)

## 2021-08-27 ENCOUNTER — Telehealth: Payer: Self-pay

## 2021-08-27 DIAGNOSIS — E782 Mixed hyperlipidemia: Secondary | ICD-10-CM

## 2021-08-27 MED ORDER — ATORVASTATIN CALCIUM 20 MG PO TABS: 20.0000 mg | ORAL_TABLET | Freq: Every day | ORAL | 2 refills | Status: DC

## 2021-08-27 NOTE — Telephone Encounter (Addendum)
Nicholas Massed, MD  08/24/2021  7:53 AM EDT     (10-year Framingham cardiovascular risk equals 10%) LDL cholesterol and triglycerides elevated.   I recommend a atorvastatin 20 mg, 1 tab daily, #30, refill x 2.  Fasting lipid panel and AST and ALT in 2 to 3 months, diagnosis mixed hyperlipidemia. All remaining labs were normal.   Spoke with pt regarding results/recommendations,voiced understanding.

## 2021-08-27 NOTE — Telephone Encounter (Signed)
Patient returning call about lab results from Friday.  Please call patient back when available.   Per patient to leave detailed results on his voicemail.

## 2021-08-27 NOTE — Telephone Encounter (Signed)
Message being reviewed by referral coordinator.

## 2021-08-27 NOTE — Telephone Encounter (Signed)
Patient does not want to go to [facility].  Please change urologist provider. Thank you

## 2021-09-10 ENCOUNTER — Ambulatory Visit: Payer: 59 | Admitting: Family Medicine

## 2021-09-10 ENCOUNTER — Encounter: Payer: Self-pay | Admitting: Family Medicine

## 2021-09-10 VITALS — BP 114/72 | HR 80 | Temp 98.4°F | Ht 71.0 in | Wt 232.0 lb

## 2021-09-10 DIAGNOSIS — L259 Unspecified contact dermatitis, unspecified cause: Secondary | ICD-10-CM | POA: Diagnosis not present

## 2021-09-10 MED ORDER — CETIRIZINE HCL 10 MG PO TABS: ORAL_TABLET | ORAL | 0 refills | Status: DC

## 2021-09-10 MED ORDER — PREDNISONE 10 MG PO TABS: ORAL_TABLET | ORAL | 0 refills | Status: DC

## 2021-09-10 MED ORDER — METHYLPREDNISOLONE ACETATE 80 MG/ML IJ SUSP
80.0000 mg | Freq: Once | INTRAMUSCULAR | Status: AC
  Administered 2021-09-10: 80 mg via INTRAMUSCULAR
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
## 2021-09-17 ENCOUNTER — Other Ambulatory Visit: Payer: Self-pay | Admitting: Family Medicine

## 2021-09-20 ENCOUNTER — Other Ambulatory Visit: Payer: Self-pay | Admitting: Family Medicine

## 2021-09-21 ENCOUNTER — Other Ambulatory Visit: Payer: Self-pay | Admitting: Family Medicine

## 2021-10-06 ENCOUNTER — Other Ambulatory Visit: Payer: Self-pay | Admitting: Family Medicine

## 2021-10-13 ENCOUNTER — Other Ambulatory Visit: Payer: Self-pay | Admitting: Family Medicine

## 2021-10-28 ENCOUNTER — Other Ambulatory Visit: Payer: Self-pay | Admitting: Family Medicine

## 2021-11-12 ENCOUNTER — Other Ambulatory Visit: Payer: Self-pay | Admitting: Family Medicine

## 2021-11-14 ENCOUNTER — Encounter: Payer: Self-pay | Admitting: Family Medicine

## 2021-11-23 ENCOUNTER — Telehealth: Payer: Self-pay

## 2021-11-23 NOTE — Telephone Encounter (Signed)
Pt was rescheduled for 11/21.  [facility] Day - Client Nonclinical Telephone Record  [facility] Client [facility] Day - Client Client Site [facility] - Day Provider Santiago Bumpers - MD Contact Type Call Who Is Calling Patient / Member / Family / Caregiver Caller Name Nicholas Mckinney Caller Phone Number [phone] Patient Name Nicholas Mckinney Patient DOB 08/26/1970 Call Type Message Only Information Provided Reason for Call Request to Reschedule Office Appointment Initial Comment Caller states he has an appt on the 14th, but he needs to change it. Disp. Time Disposition Final User 11/22/2021 11:59:01 AM General Information Provided Yes Socrates, Shanda Bumps Call Closed By: Devona Konig Transaction Date/Time: 11/22/2021 11:56:10 AM (ET)

## 2021-11-27 ENCOUNTER — Other Ambulatory Visit: Payer: 59

## 2021-12-04 ENCOUNTER — Other Ambulatory Visit (INDEPENDENT_AMBULATORY_CARE_PROVIDER_SITE_OTHER): Payer: 59

## 2021-12-04 DIAGNOSIS — E782 Mixed hyperlipidemia: Secondary | ICD-10-CM | POA: Diagnosis not present

## 2021-12-04 LAB — LIPID PANEL
Cholesterol: 225 mg/dL — ABNORMAL HIGH (ref 0–200)
HDL: 53.1 mg/dL (ref >39.00)
NonHDL: 171.84
Total CHOL/HDL Ratio: 4
Triglycerides: 232 mg/dL — ABNORMAL HIGH (ref 0.0–149.0)
VLDL: 46.4 mg/dL — ABNORMAL HIGH (ref 0.0–40.0)

## 2021-12-04 LAB — AST: AST: 21 U/L (ref 0–37)

## 2021-12-04 LAB — ALT: ALT: 24 U/L (ref 0–53)

## 2021-12-04 LAB — LDL CHOLESTEROL, DIRECT: Direct LDL: 155 mg/dL

## 2021-12-05 MED ORDER — ATORVASTATIN CALCIUM 40 MG PO TABS: 40.0000 mg | ORAL_TABLET | Freq: Every day | ORAL | 2 refills | Status: DC

## 2021-12-10 ENCOUNTER — Telehealth: Payer: Self-pay

## 2021-12-10 NOTE — Telephone Encounter (Signed)
Please advise on poss reaction

## 2021-12-10 NOTE — Telephone Encounter (Signed)
Yes it is likely from the increased dose of atorvastatin. Cut the 40mg  tab in half and see if pains resolve.  If they resolve then try taking 1 tab daily alternating with 1/2 tab daily. If unable to tolerate this way then try to stick with one of the 20mg  tabs daily. If next cholesterol measurement not signif improved then we'll try diff cholesterol lowering med to see if he tolerates it better. Office F/u 2-3 mo.

## 2021-12-10 NOTE — Telephone Encounter (Signed)
Spoke with patient regarding results/recommendations.  

## 2021-12-10 NOTE — Telephone Encounter (Signed)
Patient states he may be having a reaction to meds.  Patient states Dr. Milinda Cave increased med.  Patient states he is having increased joint pain.  Should this go away or can patient switch meds?   Please call [phone]

## 2021-12-17 ENCOUNTER — Encounter: Payer: Self-pay | Admitting: Family Medicine

## 2021-12-17 ENCOUNTER — Ambulatory Visit (INDEPENDENT_AMBULATORY_CARE_PROVIDER_SITE_OTHER): Payer: 59 | Admitting: Family Medicine

## 2021-12-17 VITALS — BP 126/76 | HR 84 | Temp 98.7°F | Ht 71.0 in | Wt 244.0 lb

## 2021-12-17 DIAGNOSIS — M791 Myalgia, unspecified site: Secondary | ICD-10-CM

## 2021-12-17 DIAGNOSIS — M7918 Myalgia, other site: Secondary | ICD-10-CM | POA: Diagnosis not present

## 2021-12-17 MED ORDER — CYCLOBENZAPRINE HCL 10 MG PO TABS: 10.0000 mg | ORAL_TABLET | Freq: Every day | ORAL | 0 refills | Status: DC
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
## 2021-12-18 MED ORDER — TRIAMCINOLONE ACETONIDE 40 MG/ML IJ SUSP
20.0000 mg | Freq: Once | INTRAMUSCULAR | Status: AC
  Administered 2021-12-17: 20 mg via INTRA_ARTICULAR

## 2021-12-18 NOTE — Addendum Note (Signed)
Addended by: Emi Holes D on: 12/18/2021 09:29 AM   Modules accepted: Orders

## 2021-12-21 ENCOUNTER — Other Ambulatory Visit: Payer: Self-pay | Admitting: Family Medicine

## 2021-12-22 ENCOUNTER — Other Ambulatory Visit: Payer: Self-pay | Admitting: Family Medicine

## 2022-02-11 ENCOUNTER — Ambulatory Visit: Payer: 59 | Admitting: Family Medicine

## 2022-02-11 ENCOUNTER — Encounter: Payer: Self-pay | Admitting: Family Medicine

## 2022-02-11 VITALS — BP 144/80 | HR 78 | Temp 97.7°F | Ht 71.0 in | Wt 242.0 lb

## 2022-02-11 DIAGNOSIS — J019 Acute sinusitis, unspecified: Secondary | ICD-10-CM | POA: Diagnosis not present

## 2022-02-11 DIAGNOSIS — J988 Other specified respiratory disorders: Secondary | ICD-10-CM | POA: Diagnosis not present

## 2022-02-11 DIAGNOSIS — U071 COVID-19: Secondary | ICD-10-CM | POA: Diagnosis not present

## 2022-02-11 MED ORDER — DOXYCYCLINE HYCLATE 100 MG PO CAPS: 100.0000 mg | ORAL_CAPSULE | Freq: Two times a day (BID) | ORAL | 0 refills | Status: AC
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
## 2022-02-22 ENCOUNTER — Ambulatory Visit: Payer: 59 | Admitting: Family Medicine

## 2022-03-08 ENCOUNTER — Ambulatory Visit: Payer: 59 | Admitting: Family Medicine

## 2022-03-22 ENCOUNTER — Other Ambulatory Visit: Payer: Self-pay

## 2022-03-22 ENCOUNTER — Other Ambulatory Visit: Payer: Self-pay | Admitting: Family Medicine

## 2022-03-22 ENCOUNTER — Ambulatory Visit: Payer: 59 | Admitting: Family Medicine

## 2022-03-22 MED ORDER — ATORVASTATIN CALCIUM 40 MG PO TABS: 40.0000 mg | ORAL_TABLET | Freq: Every day | ORAL | 0 refills | Status: DC

## 2022-03-22 MED ORDER — PANTOPRAZOLE SODIUM 40 MG PO TBEC: DELAYED_RELEASE_TABLET | ORAL | 0 refills | Status: DC

## 2022-03-22 MED ORDER — CETIRIZINE HCL 10 MG PO TABS: ORAL_TABLET | ORAL | 0 refills | Status: DC

## 2022-03-22 MED ORDER — PAROXETINE HCL 30 MG PO TABS: 30.0000 mg | ORAL_TABLET | Freq: Every day | ORAL | 0 refills | Status: DC
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
## 2022-03-22 NOTE — Telephone Encounter (Signed)
Pt has appt scheduled today. Will address refill then.

## 2022-04-03 ENCOUNTER — Ambulatory Visit: Payer: 59 | Admitting: Family Medicine

## 2022-04-15 ENCOUNTER — Encounter: Payer: Self-pay | Admitting: Family Medicine

## 2022-04-15 ENCOUNTER — Ambulatory Visit: Payer: 59 | Admitting: Family Medicine

## 2022-04-15 VITALS — BP 133/86 | HR 105 | Wt 238.0 lb

## 2022-04-15 DIAGNOSIS — E039 Hypothyroidism, unspecified: Secondary | ICD-10-CM

## 2022-04-15 DIAGNOSIS — R7303 Prediabetes: Secondary | ICD-10-CM

## 2022-04-15 DIAGNOSIS — F988 Other specified behavioral and emotional disorders with onset usually occurring in childhood and adolescence: Secondary | ICD-10-CM | POA: Diagnosis not present

## 2022-04-15 DIAGNOSIS — F4322 Adjustment disorder with anxiety: Secondary | ICD-10-CM

## 2022-04-15 DIAGNOSIS — E7800 Pure hypercholesterolemia, unspecified: Secondary | ICD-10-CM

## 2022-04-15 DIAGNOSIS — I10 Essential (primary) hypertension: Secondary | ICD-10-CM

## 2022-04-15 LAB — POCT GLYCOSYLATED HEMOGLOBIN (HGB A1C)
HbA1c POC (<> result, manual entry): 5.6 % (ref 4.0–5.6)
HbA1c, POC (controlled diabetic range): 5.6 % (ref 0.0–7.0)
HbA1c, POC (prediabetic range): 5.6 % — AB (ref 5.7–6.4)
Hemoglobin A1C: 5.6 % (ref 4.0–5.6)

## 2022-04-15 MED ORDER — METHYLPHENIDATE HCL ER (OSM) 27 MG PO TBCR: 27.0000 mg | EXTENDED_RELEASE_TABLET | ORAL | 0 refills | Status: DC

## 2022-04-15 MED ORDER — PAROXETINE HCL 30 MG PO TABS: 30.0000 mg | ORAL_TABLET | Freq: Every day | ORAL | 1 refills | Status: DC
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
## 2022-04-19 ENCOUNTER — Other Ambulatory Visit: Payer: Self-pay | Admitting: Family Medicine

## 2022-04-29 ENCOUNTER — Ambulatory Visit: Payer: 59 | Admitting: Family Medicine

## 2022-04-29 ENCOUNTER — Encounter: Payer: Self-pay | Admitting: Family Medicine

## 2022-04-29 VITALS — BP 144/90 | Ht 71.0 in | Wt 238.6 lb

## 2022-04-29 DIAGNOSIS — E039 Hypothyroidism, unspecified: Secondary | ICD-10-CM

## 2022-04-29 DIAGNOSIS — F988 Other specified behavioral and emotional disorders with onset usually occurring in childhood and adolescence: Secondary | ICD-10-CM

## 2022-04-29 DIAGNOSIS — I10 Essential (primary) hypertension: Secondary | ICD-10-CM

## 2022-04-29 DIAGNOSIS — E7800 Pure hypercholesterolemia, unspecified: Secondary | ICD-10-CM

## 2022-04-29 LAB — COMPREHENSIVE METABOLIC PANEL
ALT: 22 U/L (ref 0–53)
AST: 19 U/L (ref 0–37)
Albumin: 4.3 g/dL (ref 3.5–5.2)
Alkaline Phosphatase: 79 U/L (ref 39–117)
BUN: 21 mg/dL (ref 6–23)
CO2: 25 mEq/L (ref 19–32)
Calcium: 9.8 mg/dL (ref 8.4–10.5)
Chloride: 105 mEq/L (ref 96–112)
Creatinine, Ser: 0.83 mg/dL (ref 0.40–1.50)
GFR: 100.49 mL/min (ref >60.00)
Glucose, Bld: 111 mg/dL — ABNORMAL HIGH (ref 70–99)
Potassium: 5.1 mEq/L (ref 3.5–5.1)
Sodium: 139 mEq/L (ref 135–145)
Total Bilirubin: 0.5 mg/dL (ref 0.2–1.2)
Total Protein: 7.1 g/dL (ref 6.0–8.3)

## 2022-04-29 LAB — LIPID PANEL
Cholesterol: 170 mg/dL (ref 0–200)
HDL: 52.1 mg/dL (ref >39.00)
LDL Cholesterol: 97 mg/dL (ref 0–99)
NonHDL: 118.17
Total CHOL/HDL Ratio: 3
Triglycerides: 107 mg/dL (ref 0.0–149.0)
VLDL: 21.4 mg/dL (ref 0.0–40.0)

## 2022-04-29 LAB — TSH: TSH: 0.63 u[IU]/mL (ref 0.35–5.50)

## 2022-04-29 MED ORDER — METHYLPHENIDATE HCL ER (OSM) 27 MG PO TBCR: 27.0000 mg | EXTENDED_RELEASE_TABLET | ORAL | 0 refills | Status: DC
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
## 2022-05-19 ENCOUNTER — Other Ambulatory Visit: Payer: Self-pay | Admitting: Family Medicine

## 2022-06-29 ENCOUNTER — Other Ambulatory Visit: Payer: Self-pay | Admitting: Family Medicine

## 2022-07-07 ENCOUNTER — Other Ambulatory Visit: Payer: Self-pay | Admitting: Family Medicine

## 2022-07-10 ENCOUNTER — Ambulatory Visit: Payer: 59 | Admitting: Family Medicine

## 2022-07-10 ENCOUNTER — Encounter: Payer: Self-pay | Admitting: Family Medicine

## 2022-07-10 VITALS — BP 138/80 | HR 75 | Wt 245.0 lb

## 2022-07-10 DIAGNOSIS — M79672 Pain in left foot: Secondary | ICD-10-CM

## 2022-07-10 DIAGNOSIS — M722 Plantar fascial fibromatosis: Secondary | ICD-10-CM

## 2022-07-10 MED ORDER — TRIAMCINOLONE ACETONIDE 40 MG/ML IJ SUSP
40.0000 mg | Freq: Once | INTRAMUSCULAR | Status: AC
Start: 2022-07-10 — End: 2022-07-10
  Administered 2022-07-10: 40 mg via INTRA_ARTICULAR

## 2022-07-10 MED ORDER — METHYLPHENIDATE HCL ER (OSM) 27 MG PO TBCR: 27.0000 mg | EXTENDED_RELEASE_TABLET | ORAL | 0 refills | Status: DC
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
## 2022-07-10 NOTE — Addendum Note (Signed)
Addended by: Arty Baumgartner A on: 07/10/2022 04:31 PM   Modules accepted: Orders

## 2022-07-12 ENCOUNTER — Other Ambulatory Visit: Payer: Self-pay | Admitting: Family Medicine

## 2022-07-16 ENCOUNTER — Telehealth: Payer: Self-pay | Admitting: Family Medicine

## 2022-07-16 NOTE — Telephone Encounter (Signed)
Prescription Request  07/16/2022  LOV: 07/10/2022  What is the name of the medication or equipment?   methylphenidate (CONCERTA) 27 MG PO CR tablet, albuterol (VENTOLIN HFA) 108 (90 Base) MCG/ACT inhaler    Have you contacted your pharmacy to request a refill? Yes   Which pharmacy would you like this sent to?  [id number] - [place], [place] - [address]   Patient notified that their request is being sent to the clinical staff for review and that they should receive a response within 2 business days.   Please advise at Mobile [phone] (mobile)

## 2022-07-17 MED ORDER — ALBUTEROL SULFATE HFA 108 (90 BASE) MCG/ACT IN AERS: INHALATION_SPRAY | RESPIRATORY_TRACT | 0 refills | Status: DC

## 2022-07-17 NOTE — Telephone Encounter (Signed)
Patient made aware rx sent 

## 2022-07-17 NOTE — Telephone Encounter (Signed)
Noted  

## 2022-07-17 NOTE — Telephone Encounter (Signed)
RF request for albuterol LOV: 07/10/22 Next ov: 11/08/22 Last written: 09/21/21 (8.5,0)  Requesting: methylphenidate Contract: 04/24/22; signed 04/29/22 UDS: N/A Last Visit: 07/10/22 Next Visit: 11/08/22 Last Refill: 07/10/22 (30,0) refilled 1 week ago  LVM for pt to return to call. Concerta rx should be available at his pharmacy. Refill sent for inhaler

## 2022-08-13 ENCOUNTER — Other Ambulatory Visit: Payer: Self-pay | Admitting: Family Medicine

## 2022-08-16 ENCOUNTER — Other Ambulatory Visit: Payer: Self-pay | Admitting: Family Medicine

## 2022-08-21 ENCOUNTER — Other Ambulatory Visit: Payer: Self-pay

## 2022-08-21 ENCOUNTER — Encounter: Payer: Self-pay | Admitting: Family Medicine

## 2022-08-21 ENCOUNTER — Ambulatory Visit: Payer: 59 | Admitting: Family Medicine

## 2022-08-21 VITALS — BP 121/81 | HR 65 | Wt 239.8 lb

## 2022-08-21 DIAGNOSIS — K529 Noninfective gastroenteritis and colitis, unspecified: Secondary | ICD-10-CM

## 2022-08-21 NOTE — Telephone Encounter (Signed)
Prescription Request  08/21/2022  LOV: Visit date not found  What is the name of the medication or equipment? methylphenidate (CONCERTA) 27 MG PO CR tablet   Have you contacted your pharmacy to request a refill? No   Which pharmacy would you like this sent to?  [id number] [place], [place] - [address] [place] [address] Phone: [phone] Fax: [phone]  [facility] [address] [place], [place] - [address] [place] [address] Phone: [phone] Fax: [phone]  [id number] - [place], [place] - [address] [facility] [address] [place] [address] Phone: [phone] Fax: [phone]  [id number] [address], [place] - [address] [place] [address] Phone: [phone] Fax: [phone]    Patient notified that their request is being sent to the clinical staff for review and that they should receive a response within 2 business days.   Please advise at Mobile [phone] (mobile)
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
## 2022-08-21 NOTE — Telephone Encounter (Signed)
Requesting: concerta Contract: 04/24/22 UDS: N/A Last Visit: 08/21/22 Next Visit: 11/08/22 Last Refill: 07/10/22 (30,0)  Please Advise. Med pending

## 2022-08-22 MED ORDER — METHYLPHENIDATE HCL ER (OSM) 27 MG PO TBCR: 27.0000 mg | EXTENDED_RELEASE_TABLET | ORAL | 0 refills | Status: DC

## 2022-08-28 ENCOUNTER — Encounter: Payer: Self-pay | Admitting: Family Medicine

## 2022-08-28 MED ORDER — LISINOPRIL 20 MG PO TABS: 20.0000 mg | ORAL_TABLET | Freq: Every day | ORAL | 3 refills | Status: DC

## 2022-09-03 ENCOUNTER — Telehealth: Payer: Self-pay

## 2022-09-03 NOTE — Telephone Encounter (Signed)
Returning Nicholas Mckinney' call regarding recent ED visit. He was told he needed to follow up with PCP if not any better. Patient said he would give it a week to see how back pain, if not better he will schedule appt with Dr. Milinda Cave

## 2022-09-03 NOTE — Telephone Encounter (Signed)
Noted  

## 2022-09-17 ENCOUNTER — Ambulatory Visit: Payer: 59 | Admitting: Family Medicine

## 2022-09-25 ENCOUNTER — Other Ambulatory Visit: Payer: Self-pay | Admitting: Family Medicine

## 2022-09-26 MED ORDER — METHYLPHENIDATE HCL ER (OSM) 27 MG PO TBCR: 27.0000 mg | EXTENDED_RELEASE_TABLET | ORAL | 0 refills | Status: DC

## 2022-09-26 NOTE — Telephone Encounter (Signed)
Pt advised refill sent. °

## 2022-09-26 NOTE — Telephone Encounter (Signed)
Requesting: methylphenidate (CONCERTA) 27 MG PO CR tablet  Contract: 04/29/22 UDS: N/A Last Visit: 08/21/22 Next Visit: 11/08/22  Last Refill: 08/22/22 (30,0)  Please Advise. Med pending

## 2022-10-16 ENCOUNTER — Other Ambulatory Visit: Payer: Self-pay | Admitting: Family Medicine

## 2022-10-21 ENCOUNTER — Telehealth: Payer: Self-pay

## 2022-10-21 ENCOUNTER — Other Ambulatory Visit: Payer: Self-pay

## 2022-10-21 MED ORDER — PANTOPRAZOLE SODIUM 40 MG PO TBEC: DELAYED_RELEASE_TABLET | ORAL | 0 refills | Status: DC

## 2022-10-21 NOTE — Telephone Encounter (Signed)
Patient states that he only received #7 pantoprazole (PROTONIX) 40 MG tablet  When he is to be getting 90 d/s.  Please advise.  [facility] [place]

## 2022-10-21 NOTE — Telephone Encounter (Signed)
Updated refill sent. 

## 2022-10-25 ENCOUNTER — Other Ambulatory Visit: Payer: Self-pay | Admitting: Family Medicine

## 2022-10-28 MED ORDER — METHYLPHENIDATE HCL ER (OSM) 27 MG PO TBCR: 27.0000 mg | EXTENDED_RELEASE_TABLET | ORAL | 0 refills | Status: DC

## 2022-11-08 ENCOUNTER — Encounter: Payer: 59 | Admitting: Family Medicine

## 2022-11-11 ENCOUNTER — Other Ambulatory Visit: Payer: Self-pay | Admitting: Family Medicine

## 2022-11-15 ENCOUNTER — Ambulatory Visit (INDEPENDENT_AMBULATORY_CARE_PROVIDER_SITE_OTHER): Payer: 59 | Admitting: Family Medicine

## 2022-11-15 ENCOUNTER — Encounter: Payer: Self-pay | Admitting: Family Medicine

## 2022-11-15 VITALS — BP 138/88 | HR 93 | Ht 71.0 in | Wt 244.8 lb

## 2022-11-15 DIAGNOSIS — F419 Anxiety disorder, unspecified: Secondary | ICD-10-CM | POA: Diagnosis not present

## 2022-11-15 DIAGNOSIS — R7303 Prediabetes: Secondary | ICD-10-CM | POA: Diagnosis not present

## 2022-11-15 DIAGNOSIS — F988 Other specified behavioral and emotional disorders with onset usually occurring in childhood and adolescence: Secondary | ICD-10-CM

## 2022-11-15 DIAGNOSIS — E7800 Pure hypercholesterolemia, unspecified: Secondary | ICD-10-CM

## 2022-11-15 DIAGNOSIS — Z0000 Encounter for general adult medical examination without abnormal findings: Secondary | ICD-10-CM

## 2022-11-15 DIAGNOSIS — Z79899 Other long term (current) drug therapy: Secondary | ICD-10-CM | POA: Diagnosis not present

## 2022-11-15 DIAGNOSIS — I10 Essential (primary) hypertension: Secondary | ICD-10-CM | POA: Diagnosis not present

## 2022-11-15 DIAGNOSIS — Z125 Encounter for screening for malignant neoplasm of prostate: Secondary | ICD-10-CM

## 2022-11-15 DIAGNOSIS — Z0001 Encounter for general adult medical examination with abnormal findings: Secondary | ICD-10-CM

## 2022-11-15 MED ORDER — ALPRAZOLAM 0.5 MG PO TABS
ORAL_TABLET | ORAL | 0 refills | Status: AC
Start: 2022-11-15 — End: ?

## 2022-11-15 MED ORDER — PAROXETINE HCL 30 MG PO TABS: 30.0000 mg | ORAL_TABLET | Freq: Every day | ORAL | 11 refills | Status: DC
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
## 2022-11-16 LAB — COMPREHENSIVE METABOLIC PANEL
AG Ratio: 1.5 (calc) (ref 1.0–2.5)
ALT: 28 U/L (ref 9–46)
AST: 23 U/L (ref 10–35)
Albumin: 4.6 g/dL (ref 3.6–5.1)
Alkaline phosphatase (APISO): 86 U/L (ref 35–144)
BUN: 17 mg/dL (ref 7–25)
CO2: 28 mmol/L (ref 20–32)
Calcium: 9.8 mg/dL (ref 8.6–10.3)
Chloride: 103 mmol/L (ref 98–110)
Creat: 0.91 mg/dL (ref 0.70–1.30)
Globulin: 3 g/dL (ref 1.9–3.7)
Glucose, Bld: 101 mg/dL — ABNORMAL HIGH (ref 65–99)
Potassium: 4.6 mmol/L (ref 3.5–5.3)
Sodium: 138 mmol/L (ref 135–146)
Total Bilirubin: 0.7 mg/dL (ref 0.2–1.2)
Total Protein: 7.6 g/dL (ref 6.1–8.1)

## 2022-11-16 LAB — PSA: PSA: 0.32 ng/mL (ref <=4.00)

## 2022-11-16 LAB — LIPID PANEL
Cholesterol: 176 mg/dL (ref <200)
HDL: 60 mg/dL (ref >=40)
LDL Cholesterol (Calc): 93 mg/dL
Non-HDL Cholesterol (Calc): 116 mg/dL (ref <130)
Total CHOL/HDL Ratio: 2.9 (calc) (ref <5.0)
Triglycerides: 129 mg/dL (ref <150)

## 2022-11-16 LAB — TSH: TSH: 0.71 m[IU]/L (ref 0.40–4.50)

## 2022-11-16 LAB — HEMOGLOBIN A1C
Hgb A1c MFr Bld: 6.2 %{Hb} — ABNORMAL HIGH (ref <5.7)
Mean Plasma Glucose: 131 mg/dL
eAG (mmol/L): 7.3 mmol/L

## 2022-12-01 ENCOUNTER — Other Ambulatory Visit: Payer: Self-pay | Admitting: Family Medicine

## 2022-12-02 MED ORDER — METHYLPHENIDATE HCL ER (OSM) 27 MG PO TBCR: 27.0000 mg | EXTENDED_RELEASE_TABLET | ORAL | 0 refills | Status: DC

## 2022-12-02 NOTE — Telephone Encounter (Signed)
[facility] [place]. Last OV 11/15/22

## 2022-12-15 ENCOUNTER — Other Ambulatory Visit: Payer: Self-pay | Admitting: Family Medicine

## 2022-12-16 ENCOUNTER — Encounter: Payer: Self-pay | Admitting: Family Medicine

## 2022-12-17 ENCOUNTER — Other Ambulatory Visit: Payer: Self-pay

## 2022-12-17 MED ORDER — METHYLPHENIDATE HCL ER (OSM) 27 MG PO TBCR: 27.0000 mg | EXTENDED_RELEASE_TABLET | ORAL | 0 refills | Status: DC

## 2022-12-17 NOTE — Telephone Encounter (Signed)
Okay, prescription sent 

## 2022-12-18 ENCOUNTER — Encounter: Payer: Self-pay | Admitting: Urgent Care

## 2022-12-18 ENCOUNTER — Ambulatory Visit: Payer: 59 | Admitting: Urgent Care

## 2022-12-18 VITALS — BP 144/89 | HR 80 | Wt 253.0 lb

## 2022-12-18 DIAGNOSIS — R7303 Prediabetes: Secondary | ICD-10-CM

## 2022-12-18 DIAGNOSIS — K5792 Diverticulitis of intestine, part unspecified, without perforation or abscess without bleeding: Secondary | ICD-10-CM | POA: Diagnosis not present

## 2022-12-18 DIAGNOSIS — K5732 Diverticulitis of large intestine without perforation or abscess without bleeding: Secondary | ICD-10-CM | POA: Diagnosis not present

## 2022-12-18 MED ORDER — CIPROFLOXACIN HCL 500 MG PO TABS: 500.0000 mg | ORAL_TABLET | Freq: Two times a day (BID) | ORAL | 0 refills | Status: DC

## 2022-12-18 MED ORDER — METRONIDAZOLE 500 MG PO TABS: 500.0000 mg | ORAL_TABLET | Freq: Three times a day (TID) | ORAL | 0 refills | Status: AC
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
## 2022-12-18 NOTE — Assessment & Plan Note (Signed)
Acute Diverticulitis Recurrent episode, third time. Started yesterday with abdominal pain, softer stools, no fever, no blood in stool, no nausea/vomiting, no urinary symptoms. No signs of perforation or abscess on examination. Patient has a known allergy to penicillins. -Start combination therapy with Ciprofloxacin and Flagyl, as per previous successful treatment. -BBW to cipro discussed -ER precautions reviewed

## 2022-12-18 NOTE — Patient Instructions (Addendum)
Your symptoms are consistent with acute diverticulitis. Please take cipro twice daily until gone. If you develop severe ankle pain while taking this medication, please stop it immediately and notify our office. Read all BBW label instructions. Take flagyl three times daily until gone.  IF YOU DEVELOP BLOOD IN YOUR STOOL, WORSENING ABDOMINAL PAIN, OR A FEVER, PLEASE HEAD TO THE ER.  Regarding your glucose, you MUST cut back on sugar you are drinking!! You are drinking enough sugar to constitute 20+ servings of carbohydrates daily. Try to cut this in half and eventually wean out. Water is the best option for drinking!  Return for a recheck in 2-3 months to re-assess your A1C after cutting back on sodas and beer.

## 2022-12-19 DIAGNOSIS — R7303 Prediabetes: Secondary | ICD-10-CM | POA: Insufficient documentation

## 2022-12-19 NOTE — Assessment & Plan Note (Signed)
We did spend extensive time discussing his beverage choices. Pt aware that he is drinking enough sugar to account for 20 carbohydrate exchanges, or roughly 7 meals. This is in addition to his food choices. Strongly encouraged pt to cut back over time with a goal of complete cessation of soda as this will help with weight loss and glucose regulation.

## 2023-01-11 ENCOUNTER — Other Ambulatory Visit: Payer: Self-pay | Admitting: Family Medicine

## 2023-01-15 ENCOUNTER — Other Ambulatory Visit: Payer: Self-pay | Admitting: Family Medicine

## 2023-01-23 ENCOUNTER — Ambulatory Visit: Payer: 59 | Admitting: Family Medicine

## 2023-02-14 ENCOUNTER — Other Ambulatory Visit: Payer: Self-pay | Admitting: Family Medicine

## 2023-02-19 ENCOUNTER — Other Ambulatory Visit: Payer: Self-pay | Admitting: Family Medicine

## 2023-02-19 MED ORDER — METHYLPHENIDATE HCL ER (OSM) 27 MG PO TBCR: 27.0000 mg | EXTENDED_RELEASE_TABLET | ORAL | 0 refills | Status: DC

## 2023-02-19 NOTE — Telephone Encounter (Signed)
 Refill requested for Methylphenidate  sent to [facility] on [address]. Next OV 03/19/23

## 2023-03-16 ENCOUNTER — Other Ambulatory Visit: Payer: Self-pay | Admitting: Family Medicine

## 2023-03-17 NOTE — Patient Instructions (Signed)

## 2023-03-19 ENCOUNTER — Ambulatory Visit: Payer: 59 | Admitting: Family Medicine

## 2023-03-19 ENCOUNTER — Encounter: Payer: Self-pay | Admitting: Family Medicine

## 2023-03-19 VITALS — BP 136/80 | HR 72 | Temp 98.9°F | Ht 71.0 in | Wt 255.8 lb

## 2023-03-19 DIAGNOSIS — R7303 Prediabetes: Secondary | ICD-10-CM

## 2023-03-19 DIAGNOSIS — I10 Essential (primary) hypertension: Secondary | ICD-10-CM

## 2023-03-19 DIAGNOSIS — F988 Other specified behavioral and emotional disorders with onset usually occurring in childhood and adolescence: Secondary | ICD-10-CM

## 2023-03-19 DIAGNOSIS — H9191 Unspecified hearing loss, right ear: Secondary | ICD-10-CM

## 2023-03-19 MED ORDER — METHYLPHENIDATE HCL ER (OSM) 27 MG PO TBCR: 27.0000 mg | EXTENDED_RELEASE_TABLET | ORAL | 0 refills | Status: DC

## 2023-03-19 MED ORDER — PANTOPRAZOLE SODIUM 40 MG PO TBEC: 40.0000 mg | DELAYED_RELEASE_TABLET | Freq: Every day | ORAL | 1 refills | Status: DC

## 2023-03-19 MED ORDER — LEVOTHYROXINE SODIUM 88 MCG PO TABS: 88.0000 ug | ORAL_TABLET | Freq: Every day | ORAL | 1 refills | Status: DC
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
## 2023-03-20 LAB — BASIC METABOLIC PANEL
BUN: 13 mg/dL (ref 6–23)
CO2: 29 meq/L (ref 19–32)
Calcium: 9.7 mg/dL (ref 8.4–10.5)
Chloride: 101 meq/L (ref 96–112)
Creatinine, Ser: 0.91 mg/dL (ref 0.40–1.50)
GFR: 96.17 mL/min (ref >60.00)
Glucose, Bld: 104 mg/dL — ABNORMAL HIGH (ref 70–99)
Potassium: 4.8 meq/L (ref 3.5–5.1)
Sodium: 137 meq/L (ref 135–145)

## 2023-03-20 LAB — HEMOGLOBIN A1C: Hgb A1c MFr Bld: 6.5 % (ref 4.6–6.5)

## 2023-03-21 ENCOUNTER — Ambulatory Visit

## 2023-03-23 ENCOUNTER — Encounter: Payer: Self-pay | Admitting: Family Medicine

## 2023-03-24 NOTE — Telephone Encounter (Signed)
 Sounds fine

## 2023-05-05 ENCOUNTER — Other Ambulatory Visit: Payer: Self-pay | Admitting: Family Medicine

## 2023-05-05 MED ORDER — METHYLPHENIDATE HCL ER (OSM) 27 MG PO TBCR: 27.0000 mg | EXTENDED_RELEASE_TABLET | ORAL | 0 refills | Status: DC

## 2023-05-07 ENCOUNTER — Ambulatory Visit: Admitting: Urgent Care

## 2023-05-09 ENCOUNTER — Encounter (INDEPENDENT_AMBULATORY_CARE_PROVIDER_SITE_OTHER): Payer: Self-pay | Admitting: Otolaryngology

## 2023-05-16 ENCOUNTER — Other Ambulatory Visit: Payer: Self-pay | Admitting: Family Medicine

## 2023-05-21 ENCOUNTER — Ambulatory Visit: Admitting: Family Medicine

## 2023-05-21 ENCOUNTER — Encounter: Payer: Self-pay | Admitting: Family Medicine

## 2023-05-21 VITALS — BP 125/77 | HR 79 | Temp 99.1°F | Ht 71.0 in | Wt 245.0 lb

## 2023-05-21 DIAGNOSIS — N62 Hypertrophy of breast: Secondary | ICD-10-CM | POA: Diagnosis not present
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
## 2023-06-05 ENCOUNTER — Encounter: Payer: Self-pay | Admitting: Family Medicine

## 2023-06-05 MED ORDER — METHYLPHENIDATE HCL ER (OSM) 27 MG PO TBCR: 27.0000 mg | EXTENDED_RELEASE_TABLET | ORAL | 0 refills | Status: DC

## 2023-06-05 NOTE — Telephone Encounter (Signed)
 Medication last refilled 4/21 for 30 days and patient was seen 05/21/23.

## 2023-07-07 ENCOUNTER — Encounter: Payer: Self-pay | Admitting: Family Medicine

## 2023-07-07 MED ORDER — METHYLPHENIDATE HCL ER (OSM) 27 MG PO TBCR: 27.0000 mg | EXTENDED_RELEASE_TABLET | ORAL | 0 refills | Status: DC

## 2023-07-07 NOTE — Telephone Encounter (Signed)
 Prescription sent

## 2023-07-16 ENCOUNTER — Other Ambulatory Visit: Payer: Self-pay | Admitting: Family Medicine

## 2023-07-17 ENCOUNTER — Emergency Department (HOSPITAL_BASED_OUTPATIENT_CLINIC_OR_DEPARTMENT_OTHER)

## 2023-07-17 ENCOUNTER — Encounter (HOSPITAL_BASED_OUTPATIENT_CLINIC_OR_DEPARTMENT_OTHER): Payer: Self-pay | Admitting: Emergency Medicine

## 2023-07-17 ENCOUNTER — Other Ambulatory Visit: Payer: Self-pay

## 2023-07-17 ENCOUNTER — Emergency Department (HOSPITAL_BASED_OUTPATIENT_CLINIC_OR_DEPARTMENT_OTHER)
Admission: EM | Admit: 2023-07-17 | Discharge: 2023-07-17 | Disposition: A | Discharge status: Home / Self Care | Attending: Emergency Medicine | Admitting: Emergency Medicine

## 2023-07-17 ENCOUNTER — Emergency Department (HOSPITAL_BASED_OUTPATIENT_CLINIC_OR_DEPARTMENT_OTHER): Admitting: Radiology

## 2023-07-17 DIAGNOSIS — S61214A Laceration without foreign body of right ring finger without damage to nail, initial encounter: Secondary | ICD-10-CM | POA: Diagnosis not present

## 2023-07-17 DIAGNOSIS — S61212A Laceration without foreign body of right middle finger without damage to nail, initial encounter: Secondary | ICD-10-CM | POA: Insufficient documentation

## 2023-07-17 DIAGNOSIS — S61210A Laceration without foreign body of right index finger without damage to nail, initial encounter: Secondary | ICD-10-CM | POA: Diagnosis not present

## 2023-07-17 DIAGNOSIS — S61216A Laceration without foreign body of right little finger without damage to nail, initial encounter: Secondary | ICD-10-CM | POA: Insufficient documentation

## 2023-07-17 DIAGNOSIS — S62639B Displaced fracture of distal phalanx of unspecified finger, initial encounter for open fracture: Secondary | ICD-10-CM

## 2023-07-17 DIAGNOSIS — S62633B Displaced fracture of distal phalanx of left middle finger, initial encounter for open fracture: Secondary | ICD-10-CM | POA: Diagnosis not present

## 2023-07-17 DIAGNOSIS — Z23 Encounter for immunization: Secondary | ICD-10-CM | POA: Insufficient documentation

## 2023-07-17 DIAGNOSIS — W208XXA Other cause of strike by thrown, projected or falling object, initial encounter: Secondary | ICD-10-CM | POA: Diagnosis not present

## 2023-07-17 DIAGNOSIS — S6992XA Unspecified injury of left wrist, hand and finger(s), initial encounter: Secondary | ICD-10-CM | POA: Diagnosis present

## 2023-07-17 MED ORDER — OXYCODONE-ACETAMINOPHEN 5-325 MG PO TABS: 1.0000 | ORAL_TABLET | Freq: Four times a day (QID) | ORAL | 0 refills | Status: DC | PRN

## 2023-07-17 MED ORDER — CLINDAMYCIN PHOSPHATE 900 MG/50ML IV SOLN
900.0000 mg | Freq: Three times a day (TID) | INTRAVENOUS | Status: DC
  Administered 2023-07-17: 900 mg via INTRAVENOUS
  Filled 2023-07-17: qty 50

## 2023-07-17 MED ORDER — CLINDAMYCIN HCL 150 MG PO CAPS
300.0000 mg | ORAL_CAPSULE | Freq: Two times a day (BID) | ORAL | 0 refills | Status: AC
Start: 2023-07-17 — End: ?

## 2023-07-17 MED ORDER — TETANUS-DIPHTH-ACELL PERTUSSIS 5-2.5-18.5 LF-MCG/0.5 IM SUSY
0.5000 mL | PREFILLED_SYRINGE | Freq: Once | INTRAMUSCULAR | Status: AC
  Administered 2023-07-17: 0.5 mL via INTRAMUSCULAR
  Filled 2023-07-17: qty 0.5

## 2023-07-17 MED ORDER — OXYCODONE-ACETAMINOPHEN 5-325 MG PO TABS
1.0000 | ORAL_TABLET | Freq: Once | ORAL | Status: AC
  Administered 2023-07-17: 1 via ORAL
  Filled 2023-07-17: qty 1

## 2023-07-17 MED ORDER — BUPIVACAINE HCL (PF) 0.5 % IJ SOLN
30.0000 mL | Freq: Once | INTRAMUSCULAR | Status: AC
  Administered 2023-07-17: 30 mL
  Filled 2023-07-17: qty 30
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
## 2023-07-17 NOTE — Discharge Instructions (Addendum)
 Today you were seen for an open fracture of your left middle finger.  Please keep the area clean and dry.  Please pick up your antibiotic and take as prescribed.  You have also been prescribed Percocet for severe pain.  You may take Tylenol  Motrin  as needed for mild to moderate pain.  Please follow-up with Dr. Delene with hand surgery as soon as possible.  Please return to the ED if you have right streaking up your hand, puslike discharge, worsening pain, or other signs of infection.  Thank you for letting us  treat you today. After reviewing your imaging, I feel you are safe to go home. Please follow up with your PCP in the next several days and provide them with your records from this visit. Return to the Emergency Room if pain becomes severe or symptoms worsen.

## 2023-08-09 ENCOUNTER — Encounter: Payer: Self-pay | Admitting: Family Medicine

## 2023-08-11 ENCOUNTER — Other Ambulatory Visit: Payer: Self-pay | Admitting: Family Medicine

## 2023-08-11 MED ORDER — METHYLPHENIDATE HCL ER (OSM) 27 MG PO TBCR: 27.0000 mg | EXTENDED_RELEASE_TABLET | ORAL | 0 refills | Status: DC

## 2023-08-11 NOTE — Telephone Encounter (Signed)
 Requesting: methylphenidate  Contract: 04/29/22 UDS: N/A Last Visit: 05/21/23 Next Visit: N/A Last Refill: 07/07/23 (30,0)  Please Advise. Rx pending

## 2023-08-18 ENCOUNTER — Other Ambulatory Visit: Payer: Self-pay | Admitting: Family Medicine

## 2023-09-12 ENCOUNTER — Encounter: Payer: Self-pay | Admitting: Family Medicine

## 2023-09-12 NOTE — Telephone Encounter (Signed)
 methylphenidate  (CONCERTA ) 27 MG PO CR tablet [[id number]]  Last refill 08/11/23(30,0)  Please fill, if appropriate.

## 2023-09-14 MED ORDER — METHYLPHENIDATE HCL ER (OSM) 27 MG PO TBCR: 27.0000 mg | EXTENDED_RELEASE_TABLET | ORAL | 0 refills | Status: DC

## 2023-09-26 ENCOUNTER — Other Ambulatory Visit: Payer: Self-pay | Admitting: Family Medicine

## 2023-10-16 ENCOUNTER — Encounter: Payer: Self-pay | Admitting: Family Medicine

## 2023-10-16 MED ORDER — METHYLPHENIDATE HCL ER (OSM) 27 MG PO TBCR: 27.0000 mg | EXTENDED_RELEASE_TABLET | ORAL | 0 refills | Status: DC

## 2023-10-16 NOTE — Telephone Encounter (Signed)
 Okay, prescription sent. Patient needs follow-up.

## 2023-10-23 ENCOUNTER — Ambulatory Visit: Payer: Self-pay

## 2023-10-23 ENCOUNTER — Other Ambulatory Visit: Payer: Self-pay | Admitting: Family Medicine

## 2023-10-23 NOTE — Telephone Encounter (Signed)
 FYI Only or Action Required?: Action required by provider: request for documentation or forms and please see reason for dispo notes.  Patient was last seen in primary care on 05/21/2023 by [name], [name], MD.  Called Nurse Triage reporting Facial Pain.  Symptoms began a week ago.  Interventions attempted: Nothing.  Symptoms are: unchanged.  Triage Disposition: See Physician Within 24 Hours  Patient/caregiver understands and will follow disposition?: Yes         Copied from CRM #[id number]. Topic: Clinical - Red Word Triage >> Oct 23, 2023  8:09 AM Burnard DEL wrote: Red Word that prompted transfer to Nurse Triage: discolored mucus,sinus pressure,swollen sinus canal Reason for Disposition  Earache    Of note, pt is requesting medical records at his appt so that he can start process of TOC. [facility] attempted to let pt know that it will require processing time to get information together and pt stated no it don't... I've talked to my attorney and it's illegal to withhold my information and then call was disconnected.  Answer Assessment - Initial Assessment Questions 1. LOCATION: Where does it hurt?      Under left ear radiating towards jaw  2. ONSET: When did the sinus pain start?  (e.g., hours, days)      X 1 week 3. SEVERITY: How bad is the pain?   (Scale 0-10; or none, mild, moderate or severe)     *No Answer* 4. RECURRENT SYMPTOM: Have you ever had sinus problems before? If Yes, ask: When was the last time? and What happened that time?      Yes, last time was unknown--maybe a year 5. NASAL CONGESTION: Is the nose blocked? If Yes, ask: Can you open it or must you breathe through your mouth?     N/a 6. NASAL DISCHARGE: Do you have discharge from your nose? If so ask, What color?     Yes, yellow 7. FEVER: Do you have a fever? If Yes, ask: What is it, how was it measured, and when did it start?      denies 8. OTHER SYMPTOMS: Do you have any other  symptoms? (e.g., sore throat, cough, earache, difficulty breathing)     denies 9. PREGNANCY: Is there any chance you are pregnant? When was your last menstrual period?     N/a  Protocols used: Sinus Pain or Congestion-A-AH

## 2023-10-23 NOTE — Telephone Encounter (Signed)
 Noted Pt scheduled for next day appt

## 2023-10-24 ENCOUNTER — Ambulatory Visit: Admitting: Family

## 2023-10-24 ENCOUNTER — Encounter: Payer: Self-pay | Admitting: Family

## 2023-10-24 VITALS — BP 140/83 | HR 68 | Temp 99.2°F | Wt 248.2 lb

## 2023-10-24 DIAGNOSIS — K1120 Sialoadenitis, unspecified: Secondary | ICD-10-CM

## 2023-10-24 DIAGNOSIS — R609 Edema, unspecified: Secondary | ICD-10-CM

## 2023-10-24 MED ORDER — DOXYCYCLINE HYCLATE 100 MG PO TABS: 100.0000 mg | ORAL_TABLET | Freq: Two times a day (BID) | ORAL | 0 refills | Status: DC
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
## 2023-11-18 ENCOUNTER — Other Ambulatory Visit: Payer: Self-pay | Admitting: Family Medicine

## 2023-11-18 ENCOUNTER — Encounter: Payer: Self-pay | Admitting: Family Medicine

## 2023-11-18 NOTE — Telephone Encounter (Signed)
 Requesting: methylphenidate  Contract: 04/29/22 UDS: N/A Last Visit: 10/24/23 acute Next Visit: N/A Last Refill: 10/16/23 (30,0)  Please Advise. Rx pending

## 2023-11-19 ENCOUNTER — Other Ambulatory Visit: Payer: Self-pay | Admitting: Family Medicine

## 2023-11-19 MED ORDER — METHYLPHENIDATE HCL ER (OSM) 27 MG PO TBCR: 27.0000 mg | EXTENDED_RELEASE_TABLET | ORAL | 0 refills | Status: DC

## 2023-11-19 NOTE — Telephone Encounter (Signed)
 1 month supply sent.  Needs follow-up.  Fasting.

## 2023-11-20 NOTE — Telephone Encounter (Signed)
 LVM for pt to return call regarding medication and to schedule follow up per PCP instructions.

## 2023-12-16 ENCOUNTER — Other Ambulatory Visit: Payer: Self-pay | Admitting: Family Medicine

## 2023-12-28 ENCOUNTER — Encounter: Payer: Self-pay | Admitting: Family Medicine

## 2023-12-29 ENCOUNTER — Telehealth: Payer: Self-pay

## 2023-12-29 MED ORDER — LEVOTHYROXINE SODIUM 88 MCG PO TABS: 88.0000 ug | ORAL_TABLET | Freq: Every day | ORAL | 0 refills | Status: DC

## 2023-12-29 MED ORDER — LISINOPRIL 20 MG PO TABS: 20.0000 mg | ORAL_TABLET | Freq: Every day | ORAL | 0 refills | Status: DC

## 2023-12-29 MED ORDER — ATORVASTATIN CALCIUM 40 MG PO TABS: 40.0000 mg | ORAL_TABLET | Freq: Every day | ORAL | 0 refills | Status: DC

## 2023-12-29 MED ORDER — PAROXETINE HCL 30 MG PO TABS: 30.0000 mg | ORAL_TABLET | Freq: Every day | ORAL | 0 refills | Status: DC

## 2023-12-29 MED ORDER — METHYLPHENIDATE HCL ER (OSM) 27 MG PO TBCR: 27.0000 mg | EXTENDED_RELEASE_TABLET | ORAL | 0 refills | Status: DC

## 2023-12-29 NOTE — Telephone Encounter (Signed)
 30 d/s given for  Lisinopril   Atorvastatin   Paroxetine  Levothyroxine   Pt is due for appt, last OV 10/24/23. Please fill Concerta , if appropriate. CSC 410/24, no UDS

## 2023-12-29 NOTE — Telephone Encounter (Signed)
 Pt was given 30 d/s of medications, waiting for PCP approval for Concerta .

## 2023-12-29 NOTE — Telephone Encounter (Signed)
Pt advised refills sent. °

## 2023-12-29 NOTE — Telephone Encounter (Signed)
 Reason for CRM: Pt has scheduled an office visit with Dr. [name]. Was hoping he could be sent that 30 day refill as mentioned in a MyChart message that was sent to him. Please advise.   (Please see message from patient 12/14) copied and pasted: Please refill the following medications as I am completely out of them: Concerta  Lisinopril  Atorvastatin   Paroxetine  Levothyroxine  Please call me with questions or any issues. Thanks! [phone]    Patient scheduled appt for Wednesday 12/17 at 11am with Dr. [name].

## 2023-12-31 ENCOUNTER — Encounter: Payer: Self-pay | Admitting: Family Medicine

## 2023-12-31 ENCOUNTER — Ambulatory Visit: Admitting: Family Medicine

## 2023-12-31 VITALS — BP 132/77 | HR 70 | Temp 96.4°F | Ht 71.0 in | Wt 251.0 lb

## 2023-12-31 DIAGNOSIS — I10 Essential (primary) hypertension: Secondary | ICD-10-CM | POA: Diagnosis not present

## 2023-12-31 DIAGNOSIS — E039 Hypothyroidism, unspecified: Secondary | ICD-10-CM | POA: Diagnosis not present

## 2023-12-31 DIAGNOSIS — E7800 Pure hypercholesterolemia, unspecified: Secondary | ICD-10-CM | POA: Diagnosis not present

## 2023-12-31 DIAGNOSIS — F988 Other specified behavioral and emotional disorders with onset usually occurring in childhood and adolescence: Secondary | ICD-10-CM

## 2023-12-31 DIAGNOSIS — R7303 Prediabetes: Secondary | ICD-10-CM | POA: Diagnosis not present

## 2023-12-31 DIAGNOSIS — Z0000 Encounter for general adult medical examination without abnormal findings: Secondary | ICD-10-CM | POA: Diagnosis not present

## 2023-12-31 DIAGNOSIS — Z125 Encounter for screening for malignant neoplasm of prostate: Secondary | ICD-10-CM | POA: Diagnosis not present

## 2023-12-31 DIAGNOSIS — Z79899 Other long term (current) drug therapy: Secondary | ICD-10-CM | POA: Diagnosis not present
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
## 2024-01-01 ENCOUNTER — Other Ambulatory Visit (INDEPENDENT_AMBULATORY_CARE_PROVIDER_SITE_OTHER)

## 2024-01-01 DIAGNOSIS — Z79899 Other long term (current) drug therapy: Secondary | ICD-10-CM

## 2024-01-01 DIAGNOSIS — I10 Essential (primary) hypertension: Secondary | ICD-10-CM

## 2024-01-01 DIAGNOSIS — R7303 Prediabetes: Secondary | ICD-10-CM

## 2024-01-01 DIAGNOSIS — E039 Hypothyroidism, unspecified: Secondary | ICD-10-CM

## 2024-01-01 DIAGNOSIS — F988 Other specified behavioral and emotional disorders with onset usually occurring in childhood and adolescence: Secondary | ICD-10-CM

## 2024-01-01 DIAGNOSIS — E7800 Pure hypercholesterolemia, unspecified: Secondary | ICD-10-CM | POA: Diagnosis not present

## 2024-01-01 DIAGNOSIS — Z125 Encounter for screening for malignant neoplasm of prostate: Secondary | ICD-10-CM

## 2024-01-01 LAB — COMPREHENSIVE METABOLIC PANEL WITH GFR
ALT: 24 U/L (ref 3–53)
AST: 22 U/L (ref 5–37)
Albumin: 4.3 g/dL (ref 3.5–5.2)
Alkaline Phosphatase: 81 U/L (ref 39–117)
BUN: 19 mg/dL (ref 6–23)
CO2: 27 meq/L (ref 19–32)
Calcium: 9.6 mg/dL (ref 8.4–10.5)
Chloride: 103 meq/L (ref 96–112)
Creatinine, Ser: 0.89 mg/dL (ref 0.40–1.50)
GFR: 97.24 mL/min (ref >60.00)
Glucose, Bld: 116 mg/dL — ABNORMAL HIGH (ref 70–99)
Potassium: 5 meq/L (ref 3.5–5.1)
Sodium: 138 meq/L (ref 135–145)
Total Bilirubin: 0.6 mg/dL (ref 0.2–1.2)
Total Protein: 7.2 g/dL (ref 6.0–8.3)

## 2024-01-01 LAB — CBC WITH DIFFERENTIAL/PLATELET
Basophils Absolute: 0.1 K/uL (ref 0.0–0.1)
Basophils Relative: 0.9 % (ref 0.0–3.0)
Eosinophils Absolute: 0.6 K/uL (ref 0.0–0.7)
Eosinophils Relative: 6.4 % — ABNORMAL HIGH (ref 0.0–5.0)
HCT: 44.6 % (ref 39.0–52.0)
Hemoglobin: 14.9 g/dL (ref 13.0–17.0)
Lymphocytes Relative: 22.6 % (ref 12.0–46.0)
Lymphs Abs: 2.2 K/uL (ref 0.7–4.0)
MCHC: 33.4 g/dL (ref 30.0–36.0)
MCV: 92.7 fl (ref 78.0–100.0)
Monocytes Absolute: 1.1 K/uL — ABNORMAL HIGH (ref 0.1–1.0)
Monocytes Relative: 11 % (ref 3.0–12.0)
Neutro Abs: 5.9 K/uL (ref 1.4–7.7)
Neutrophils Relative %: 59.1 % (ref 43.0–77.0)
Platelets: 408 K/uL — ABNORMAL HIGH (ref 150.0–400.0)
RBC: 4.81 Mil/uL (ref 4.22–5.81)
RDW: 13.6 % (ref 11.5–15.5)
WBC: 9.9 K/uL (ref 4.0–10.5)

## 2024-01-01 LAB — LIPID PANEL
Cholesterol: 164 mg/dL (ref 28–200)
HDL: 59.5 mg/dL (ref >39.00)
LDL Cholesterol: 79 mg/dL (ref 10–99)
NonHDL: 104.79
Total CHOL/HDL Ratio: 3
Triglycerides: 130 mg/dL (ref 10.0–149.0)
VLDL: 26 mg/dL (ref 0.0–40.0)

## 2024-01-01 LAB — TSH: TSH: 0.74 u[IU]/mL (ref 0.35–5.50)

## 2024-01-01 LAB — HEMOGLOBIN A1C: Hgb A1c MFr Bld: 6.5 % (ref 4.6–6.5)

## 2024-01-01 LAB — PSA: PSA: 0.33 ng/mL (ref 0.10–4.00)

## 2024-01-02 ENCOUNTER — Ambulatory Visit: Payer: Self-pay | Admitting: Family Medicine

## 2024-01-02 LAB — DRUG MONITOR, PANEL 1, SCREEN, URINE

## 2024-01-02 LAB — DM TEMPLATE

## 2024-01-21 ENCOUNTER — Ambulatory Visit: Payer: Self-pay

## 2024-01-21 NOTE — Telephone Encounter (Signed)
 FYI Only or Action Required?: FYI only for provider: appointment scheduled on 01/22/24.  Patient was last seen in primary care on 12/31/2023 by [name], [name], MD.  Called Nurse Triage reporting Influenza.  Symptoms began a week ago.  Interventions attempted: OTC medications: Dayquil, Nyquil, Mucinex.  Symptoms are: unchanged.  Triage Disposition: No disposition on file.  Patient/caregiver understands and will follow disposition?:  Answer Assessment - Initial Assessment Questions Patient states he did not want to waste money on a Flu test since his whole family has it. Dayquil, Nyquil, Mucinex. Patient adamant on getting an appointment to have his lungs listened to as he has had pneumonia in the past.   1. TYPE of EXPOSURE: How were you exposed? (e.g., close contact, not a close contact)     Daughters, and granddaughters close contact  2. DATE of EXPOSURE: When did the exposure occur? (e.g., hour, days, weeks)     Over a week  3. SYMPTOMS: Do you have any symptoms? (e.g., cough, fever, sore throat, difficulty breathing).     Productive cough, yellow-ish green, congestion, last temp of 101.3 was 3 days ago, no fever since. Chest pressure from coughing and congestion  4. HIGH RISK for COMPLICATIONS: Do you have any heart or lung problems? Do you have a weakened immune system? (e.g., CHF, COPD, asthma, HIV positive, chemotherapy, renal failure, diabetes mellitus, sickle cell anemia)     Denies  Protocols used: Influenza (Flu) Exposure-A-AH  Copied from CRM [id number]. Topic: Clinical - Red Word Triage >> Jan 21, 2024 11:55 AM Alfonso ORN wrote: Red Word that prompted transfer to Nurse Triage: flu since Friday. pt exp bad cough, bad congestion, trouble breathing with coughing fever over the weekend 101.3

## 2024-01-22 ENCOUNTER — Ambulatory Visit: Admitting: Family Medicine

## 2024-01-22 ENCOUNTER — Encounter: Payer: Self-pay | Admitting: Family Medicine

## 2024-01-22 VITALS — BP 120/83 | HR 80 | Temp 97.1°F | Ht 71.0 in | Wt 246.2 lb

## 2024-01-22 DIAGNOSIS — J111 Influenza due to unidentified influenza virus with other respiratory manifestations: Secondary | ICD-10-CM

## 2024-01-22 DIAGNOSIS — R0989 Other specified symptoms and signs involving the circulatory and respiratory systems: Secondary | ICD-10-CM | POA: Diagnosis not present

## 2024-01-22 DIAGNOSIS — J209 Acute bronchitis, unspecified: Secondary | ICD-10-CM | POA: Diagnosis not present

## 2024-01-22 MED ORDER — ALBUTEROL SULFATE HFA 108 (90 BASE) MCG/ACT IN AERS: INHALATION_SPRAY | RESPIRATORY_TRACT | 1 refills | Status: AC

## 2024-01-22 MED ORDER — PREDNISONE 20 MG PO TABS: ORAL_TABLET | ORAL | 0 refills | Status: AC

## 2024-01-22 MED ORDER — METHYLPREDNISOLONE ACETATE 80 MG/ML IJ SUSP
80.0000 mg | Freq: Once | INTRAMUSCULAR | Status: AC
  Administered 2024-01-22: 80 mg via INTRAMUSCULAR
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
## 2024-01-22 NOTE — Patient Instructions (Signed)
 Start your prednisone  tomorrow.  I sent a prescription for a new inhaler in today.

## 2024-01-24 ENCOUNTER — Other Ambulatory Visit: Payer: Self-pay | Admitting: Family Medicine

## 2024-01-27 ENCOUNTER — Encounter: Payer: Self-pay | Admitting: Family Medicine

## 2024-01-27 MED ORDER — METHYLPHENIDATE HCL ER (OSM) 27 MG PO TBCR: 27.0000 mg | EXTENDED_RELEASE_TABLET | ORAL | 0 refills | Status: AC

## 2024-01-27 NOTE — Telephone Encounter (Signed)
 Requesting: methylphenidate  Contract: 12/31/23 UDS: 01/01/24 Last Visit: 01/22/24 acute Next Visit: 06/30/24 Last Refill: 12/29/23 (30,0)  Please Advise. Rx pending

## 2024-06-30 ENCOUNTER — Ambulatory Visit: Admitting: Family Medicine
